# Patient Record
Sex: Male | Born: 1945 | Race: White | Hispanic: No | Marital: Married | Smoking: Never smoker
Health system: Southern US, Community
[De-identification: ages and names within clinical notes are randomized; demographics above are authoritative.]

## PROBLEM LIST (undated history)

## (undated) DIAGNOSIS — I4891 Unspecified atrial fibrillation: Secondary | ICD-10-CM

## (undated) DIAGNOSIS — I509 Heart failure, unspecified: Secondary | ICD-10-CM

## (undated) DIAGNOSIS — E119 Type 2 diabetes mellitus without complications: Secondary | ICD-10-CM

---

## 1998-08-18 ENCOUNTER — Inpatient Hospital Stay (HOSPITAL_COMMUNITY): Admission: EM | Admit: 1998-08-18 | Discharge: 1998-08-21 | Payer: Self-pay | Admitting: Emergency Medicine

## 1998-08-23 ENCOUNTER — Encounter: Admission: RE | Admit: 1998-08-23 | Discharge: 1998-08-23 | Payer: Self-pay | Admitting: Family Medicine

## 2007-03-31 ENCOUNTER — Telehealth: Payer: Self-pay | Admitting: Family Medicine

## 2007-04-01 DIAGNOSIS — G5600 Carpal tunnel syndrome, unspecified upper limb: Secondary | ICD-10-CM | POA: Insufficient documentation

## 2019-09-10 ENCOUNTER — Emergency Department (HOSPITAL_COMMUNITY): Payer: Medicare Other

## 2019-09-10 ENCOUNTER — Encounter (HOSPITAL_COMMUNITY): Payer: Self-pay | Admitting: Emergency Medicine

## 2019-09-10 ENCOUNTER — Inpatient Hospital Stay (HOSPITAL_COMMUNITY)
Admission: EM | Admit: 2019-09-10 | Discharge: 2019-10-10 | DRG: 177 | Disposition: E | Payer: Medicare Other | Attending: Internal Medicine | Admitting: Internal Medicine

## 2019-09-10 ENCOUNTER — Other Ambulatory Visit: Payer: Self-pay

## 2019-09-10 DIAGNOSIS — M7989 Other specified soft tissue disorders: Secondary | ICD-10-CM | POA: Diagnosis not present

## 2019-09-10 DIAGNOSIS — I5033 Acute on chronic diastolic (congestive) heart failure: Secondary | ICD-10-CM | POA: Diagnosis present

## 2019-09-10 DIAGNOSIS — Z66 Do not resuscitate: Secondary | ICD-10-CM | POA: Diagnosis not present

## 2019-09-10 DIAGNOSIS — E669 Obesity, unspecified: Secondary | ICD-10-CM | POA: Diagnosis present

## 2019-09-10 DIAGNOSIS — Z794 Long term (current) use of insulin: Secondary | ICD-10-CM | POA: Diagnosis not present

## 2019-09-10 DIAGNOSIS — J80 Acute respiratory distress syndrome: Secondary | ICD-10-CM

## 2019-09-10 DIAGNOSIS — Z781 Physical restraint status: Secondary | ICD-10-CM | POA: Diagnosis not present

## 2019-09-10 DIAGNOSIS — E1165 Type 2 diabetes mellitus with hyperglycemia: Secondary | ICD-10-CM | POA: Diagnosis not present

## 2019-09-10 DIAGNOSIS — I480 Paroxysmal atrial fibrillation: Secondary | ICD-10-CM | POA: Diagnosis present

## 2019-09-10 DIAGNOSIS — Z91120 Patient's intentional underdosing of medication regimen due to financial hardship: Secondary | ICD-10-CM

## 2019-09-10 DIAGNOSIS — U071 COVID-19: Secondary | ICD-10-CM | POA: Diagnosis present

## 2019-09-10 DIAGNOSIS — R339 Retention of urine, unspecified: Secondary | ICD-10-CM | POA: Diagnosis not present

## 2019-09-10 DIAGNOSIS — Z515 Encounter for palliative care: Secondary | ICD-10-CM

## 2019-09-10 DIAGNOSIS — I10 Essential (primary) hypertension: Secondary | ICD-10-CM | POA: Diagnosis present

## 2019-09-10 DIAGNOSIS — Z7901 Long term (current) use of anticoagulants: Secondary | ICD-10-CM | POA: Diagnosis not present

## 2019-09-10 DIAGNOSIS — R0602 Shortness of breath: Secondary | ICD-10-CM

## 2019-09-10 DIAGNOSIS — J189 Pneumonia, unspecified organism: Secondary | ICD-10-CM

## 2019-09-10 DIAGNOSIS — G9349 Other encephalopathy: Secondary | ICD-10-CM | POA: Diagnosis present

## 2019-09-10 DIAGNOSIS — I110 Hypertensive heart disease with heart failure: Secondary | ICD-10-CM | POA: Diagnosis present

## 2019-09-10 DIAGNOSIS — G9341 Metabolic encephalopathy: Secondary | ICD-10-CM | POA: Diagnosis present

## 2019-09-10 DIAGNOSIS — R451 Restlessness and agitation: Secondary | ICD-10-CM | POA: Diagnosis not present

## 2019-09-10 DIAGNOSIS — T45516A Underdosing of anticoagulants, initial encounter: Secondary | ICD-10-CM | POA: Diagnosis present

## 2019-09-10 DIAGNOSIS — J9601 Acute respiratory failure with hypoxia: Secondary | ICD-10-CM | POA: Diagnosis present

## 2019-09-10 DIAGNOSIS — I4891 Unspecified atrial fibrillation: Secondary | ICD-10-CM | POA: Diagnosis present

## 2019-09-10 DIAGNOSIS — I509 Heart failure, unspecified: Secondary | ICD-10-CM | POA: Insufficient documentation

## 2019-09-10 DIAGNOSIS — J1282 Pneumonia due to coronavirus disease 2019: Secondary | ICD-10-CM | POA: Diagnosis present

## 2019-09-10 DIAGNOSIS — Z6837 Body mass index (BMI) 37.0-37.9, adult: Secondary | ICD-10-CM | POA: Diagnosis not present

## 2019-09-10 DIAGNOSIS — E119 Type 2 diabetes mellitus without complications: Secondary | ICD-10-CM

## 2019-09-10 HISTORY — DX: Heart failure, unspecified: I50.9

## 2019-09-10 HISTORY — DX: Type 2 diabetes mellitus without complications: E11.9

## 2019-09-10 HISTORY — DX: Unspecified atrial fibrillation: I48.91

## 2019-09-10 LAB — D-DIMER, QUANTITATIVE: D-Dimer, Quant: 15.37 ug/mL-FEU — ABNORMAL HIGH (ref 0.00–0.50)

## 2019-09-10 LAB — GLUCOSE, CAPILLARY
Glucose-Capillary: 302 mg/dL — ABNORMAL HIGH (ref 70–99)
Glucose-Capillary: 321 mg/dL — ABNORMAL HIGH (ref 70–99)

## 2019-09-10 LAB — CBC
HCT: 47.1 % (ref 39.0–52.0)
Hemoglobin: 15.4 g/dL (ref 13.0–17.0)
MCH: 29.1 pg (ref 26.0–34.0)
MCHC: 32.7 g/dL (ref 30.0–36.0)
MCV: 88.9 fL (ref 80.0–100.0)
Platelets: 157 10*3/uL (ref 150–400)
RBC: 5.3 MIL/uL (ref 4.22–5.81)
RDW: 13.5 % (ref 11.5–15.5)
WBC: 13.8 10*3/uL — ABNORMAL HIGH (ref 4.0–10.5)
nRBC: 0 % (ref 0.0–0.2)

## 2019-09-10 LAB — CBG MONITORING, ED: Glucose-Capillary: 234 mg/dL — ABNORMAL HIGH (ref 70–99)

## 2019-09-10 LAB — COMPREHENSIVE METABOLIC PANEL
ALT: 15 U/L (ref 0–44)
AST: 27 U/L (ref 15–41)
Albumin: 2.6 g/dL — ABNORMAL LOW (ref 3.5–5.0)
Alkaline Phosphatase: 103 U/L (ref 38–126)
Anion gap: 18 — ABNORMAL HIGH (ref 5–15)
BUN: 14 mg/dL (ref 8–23)
CO2: 20 mmol/L — ABNORMAL LOW (ref 22–32)
Calcium: 8.4 mg/dL — ABNORMAL LOW (ref 8.9–10.3)
Chloride: 100 mmol/L (ref 98–111)
Creatinine, Ser: 0.87 mg/dL (ref 0.61–1.24)
GFR calc Af Amer: >60 mL/min (ref >60)
GFR calc non Af Amer: >60 mL/min (ref >60)
Glucose, Bld: 284 mg/dL — ABNORMAL HIGH (ref 70–99)
Potassium: 3.7 mmol/L (ref 3.5–5.1)
Sodium: 138 mmol/L (ref 135–145)
Total Bilirubin: 2 mg/dL — ABNORMAL HIGH (ref 0.3–1.2)
Total Protein: 6.8 g/dL (ref 6.5–8.1)

## 2019-09-10 LAB — TROPONIN I (HIGH SENSITIVITY)
Troponin I (High Sensitivity): 64 ng/L — ABNORMAL HIGH (ref <18)
Troponin I (High Sensitivity): 99 ng/L — ABNORMAL HIGH (ref <18)

## 2019-09-10 LAB — HEMOGLOBIN A1C
Hgb A1c MFr Bld: 8.5 % — ABNORMAL HIGH (ref 4.8–5.6)
Mean Plasma Glucose: 197.25 mg/dL

## 2019-09-10 LAB — PROCALCITONIN: Procalcitonin: 0.24 ng/mL

## 2019-09-10 LAB — LACTIC ACID, PLASMA
Lactic Acid, Venous: 2.4 mmol/L (ref 0.5–1.9)
Lactic Acid, Venous: 2.6 mmol/L (ref 0.5–1.9)
Lactic Acid, Venous: 3.1 mmol/L (ref 0.5–1.9)

## 2019-09-10 LAB — C-REACTIVE PROTEIN: CRP: 23.9 mg/dL — ABNORMAL HIGH (ref <1.0)

## 2019-09-10 LAB — LACTATE DEHYDROGENASE: LDH: 509 U/L — ABNORMAL HIGH (ref 98–192)

## 2019-09-10 LAB — TRIGLYCERIDES: Triglycerides: 84 mg/dL (ref <150)

## 2019-09-10 LAB — FIBRINOGEN: Fibrinogen: 631 mg/dL — ABNORMAL HIGH (ref 210–475)

## 2019-09-10 LAB — POC SARS CORONAVIRUS 2 AG -  ED: SARS Coronavirus 2 Ag: POSITIVE — AB

## 2019-09-10 LAB — FERRITIN: Ferritin: 540 ng/mL — ABNORMAL HIGH (ref 24–336)

## 2019-09-10 LAB — BRAIN NATRIURETIC PEPTIDE: B Natriuretic Peptide: 148.5 pg/mL — ABNORMAL HIGH (ref 0.0–100.0)

## 2019-09-10 LAB — ABO/RH: ABO/RH(D): O POS

## 2019-09-10 MED ORDER — IPRATROPIUM-ALBUTEROL 0.5-2.5 (3) MG/3ML IN SOLN: 3.0000 mL | RESPIRATORY_TRACT | Status: DC | PRN

## 2019-09-10 MED ORDER — SODIUM CHLORIDE 0.9 % IV SOLN: 200.0000 mg | Freq: Once | INTRAVENOUS | Status: DC

## 2019-09-10 MED ORDER — ACETAMINOPHEN 325 MG PO TABS
650.0000 mg | ORAL_TABLET | Freq: Four times a day (QID) | ORAL | Status: DC | PRN
  Administered 2019-09-12 – 2019-09-13 (×2): 650 mg via ORAL
  Filled 2019-09-10 (×2): qty 2

## 2019-09-10 MED ORDER — SODIUM CHLORIDE 0.9 % IV SOLN: 100.0000 mg | Freq: Every day | INTRAVENOUS | Status: DC

## 2019-09-10 MED ORDER — GUAIFENESIN-DM 100-10 MG/5ML PO SYRP
10.0000 mL | ORAL_SOLUTION | ORAL | Status: DC | PRN
  Administered 2019-09-12: 10 mL via ORAL
  Filled 2019-09-10: qty 10

## 2019-09-10 MED ORDER — ENOXAPARIN SODIUM 150 MG/ML ~~LOC~~ SOLN
1.0000 mg/kg | Freq: Two times a day (BID) | SUBCUTANEOUS | Status: DC
  Administered 2019-09-10 – 2019-09-11 (×2): 125 mg via SUBCUTANEOUS
  Filled 2019-09-10 (×4): qty 0.83

## 2019-09-10 MED ORDER — DILTIAZEM HCL 25 MG/5ML IV SOLN
10.0000 mg | Freq: Once | INTRAVENOUS | Status: AC
  Administered 2019-09-10: 10 mg via INTRAVENOUS
  Filled 2019-09-10: qty 5

## 2019-09-10 MED ORDER — FUROSEMIDE 10 MG/ML IJ SOLN
80.0000 mg | Freq: Once | INTRAMUSCULAR | Status: AC
  Administered 2019-09-10: 80 mg via INTRAVENOUS
  Filled 2019-09-10 (×2): qty 8

## 2019-09-10 MED ORDER — DEXAMETHASONE SODIUM PHOSPHATE 10 MG/ML IJ SOLN: 6.0000 mg | INTRAMUSCULAR | Status: DC

## 2019-09-10 MED ORDER — SODIUM CHLORIDE 0.9 % IV SOLN
200.0000 mg | Freq: Once | INTRAVENOUS | Status: DC
  Filled 2019-09-10: qty 40

## 2019-09-10 MED ORDER — INSULIN ASPART 100 UNIT/ML ~~LOC~~ SOLN
0.0000 [IU] | Freq: Every day | SUBCUTANEOUS | Status: DC
  Administered 2019-09-10 – 2019-09-13 (×4): 4 [IU] via SUBCUTANEOUS
  Administered 2019-09-14: 2 [IU] via SUBCUTANEOUS
  Administered 2019-09-15: 5 [IU] via SUBCUTANEOUS
  Administered 2019-09-16: 3 [IU] via SUBCUTANEOUS

## 2019-09-10 MED ORDER — HYDROCOD POLST-CPM POLST ER 10-8 MG/5ML PO SUER
5.0000 mL | Freq: Two times a day (BID) | ORAL | Status: DC | PRN
  Administered 2019-09-13 – 2019-09-15 (×3): 5 mL via ORAL
  Filled 2019-09-10 (×3): qty 5

## 2019-09-10 MED ORDER — APIXABAN 5 MG PO TABS
5.0000 mg | ORAL_TABLET | Freq: Two times a day (BID) | ORAL | Status: DC
  Filled 2019-09-10 (×2): qty 1

## 2019-09-10 MED ORDER — HYDRALAZINE HCL 20 MG/ML IJ SOLN: 10.0000 mg | Freq: Four times a day (QID) | INTRAMUSCULAR | Status: DC | PRN

## 2019-09-10 MED ORDER — ASCORBIC ACID 500 MG PO TABS
500.0000 mg | ORAL_TABLET | Freq: Every day | ORAL | Status: DC
  Administered 2019-09-10 – 2019-09-17 (×8): 500 mg via ORAL
  Filled 2019-09-10 (×9): qty 1

## 2019-09-10 MED ORDER — ZINC SULFATE 220 (50 ZN) MG PO CAPS
220.0000 mg | ORAL_CAPSULE | Freq: Every day | ORAL | Status: DC
  Administered 2019-09-10 – 2019-09-17 (×8): 220 mg via ORAL
  Filled 2019-09-10 (×9): qty 1

## 2019-09-10 MED ORDER — INSULIN ASPART 100 UNIT/ML ~~LOC~~ SOLN
0.0000 [IU] | Freq: Three times a day (TID) | SUBCUTANEOUS | Status: DC
  Administered 2019-09-10 – 2019-09-11 (×2): 11 [IU] via SUBCUTANEOUS
  Administered 2019-09-11: 15 [IU] via SUBCUTANEOUS
  Administered 2019-09-11: 4 [IU] via SUBCUTANEOUS
  Administered 2019-09-12 – 2019-09-13 (×4): 11 [IU] via SUBCUTANEOUS
  Administered 2019-09-13: 8 [IU] via SUBCUTANEOUS
  Administered 2019-09-13: 11 [IU] via SUBCUTANEOUS
  Administered 2019-09-14: 15 [IU] via SUBCUTANEOUS
  Administered 2019-09-14: 8 [IU] via SUBCUTANEOUS
  Administered 2019-09-15: 3 [IU] via SUBCUTANEOUS
  Administered 2019-09-15: 8 [IU] via SUBCUTANEOUS
  Administered 2019-09-16: 3 [IU] via SUBCUTANEOUS
  Administered 2019-09-16: 5 [IU] via SUBCUTANEOUS
  Administered 2019-09-16: 3 [IU] via SUBCUTANEOUS
  Administered 2019-09-17: 5 [IU] via SUBCUTANEOUS

## 2019-09-10 MED ORDER — SODIUM CHLORIDE 0.9 % IV SOLN
100.0000 mg | Freq: Two times a day (BID) | INTRAVENOUS | Status: DC
  Administered 2019-09-10 – 2019-09-16 (×12): 100 mg via INTRAVENOUS
  Filled 2019-09-10 (×16): qty 100

## 2019-09-10 MED ORDER — DEXAMETHASONE SODIUM PHOSPHATE 10 MG/ML IJ SOLN
6.0000 mg | Freq: Once | INTRAMUSCULAR | Status: AC
  Administered 2019-09-10: 6 mg via INTRAVENOUS
  Filled 2019-09-10: qty 1

## 2019-09-10 MED ORDER — IPRATROPIUM-ALBUTEROL 20-100 MCG/ACT IN AERS
1.0000 | INHALATION_SPRAY | RESPIRATORY_TRACT | Status: DC | PRN
  Administered 2019-09-12 – 2019-09-14 (×4): 1 via RESPIRATORY_TRACT
  Filled 2019-09-10: qty 4

## 2019-09-10 MED ORDER — ONDANSETRON HCL 4 MG/2ML IJ SOLN
4.0000 mg | Freq: Four times a day (QID) | INTRAMUSCULAR | Status: DC | PRN
  Filled 2019-09-10: qty 2

## 2019-09-10 MED ORDER — DEXAMETHASONE SODIUM PHOSPHATE 10 MG/ML IJ SOLN
6.0000 mg | INTRAMUSCULAR | Status: DC
  Administered 2019-09-10: 6 mg via INTRAVENOUS
  Filled 2019-09-10: qty 1

## 2019-09-10 MED ORDER — ONDANSETRON HCL 4 MG PO TABS: 4.0000 mg | ORAL_TABLET | Freq: Four times a day (QID) | ORAL | Status: DC | PRN

## 2019-09-10 NOTE — ED Provider Notes (Signed)
[facility]-EMERGENCY DEPT [facility] Emergency Department Provider Note MRN:  [id number]  Arrival date & time: 10/02/2019     Chief Complaint   Shortness of Breath   History of Present Illness   Curtis Hardy is a 74 y.o. year-old male with a history of diabetes, CHF, A. fib presenting to the ED with chief complaint of shortness of breath.  Recent admission for CHF, tested positive for COVID-19 at that time.  Was also found to have new onset A. fib.  Here with worsening shortness of breath today.  Denies chest pain, no fever, no cough, no other complaints.  Reportedly in the 50s on room air with EMS.  Review of Systems  A complete 10 system review of systems was obtained and all systems are negative except as noted in the HPI and PMH.   Patient's Health History    Past Medical History:  Diagnosis Date  . A-fib (HCC)   . CHF (congestive heart failure) (HCC)   . Diabetes mellitus without complication (HCC)     History reviewed. No pertinent surgical history.  No family history on file.  Social History   Socioeconomic History  . Marital status: Married    Spouse name: Not on file  . Number of children: Not on file  . Years of education: Not on file  . Highest education level: Not on file  Occupational History  . Not on file  Tobacco Use  . Smoking status: Never Smoker  . Smokeless tobacco: Never Used  Substance and Sexual Activity  . Alcohol use: Not Currently  . Drug use: Never  . Sexual activity: Not on file  Other Topics Concern  . Not on file  Social History Narrative  . Not on file   Social Determinants of Health   Financial Resource Strain:   . Difficulty of Paying Living Expenses:   Food Insecurity:   . Worried About Programme researcher, broadcasting/film/video in the Last Year:   . Barista in the Last Year:   Transportation Needs:   . Freight forwarder (Medical):   Marland Kitchen Lack of Transportation (Non-Medical):   Physical Activity:   . Days of Exercise per Week:   .  Minutes of Exercise per Session:   Stress:   . Feeling of Stress :   Social Connections:   . Frequency of Communication with Friends and Family:   . Frequency of Social Gatherings with Friends and Family:   . Attends Religious Services:   . Active Member of Clubs or Organizations:   . Attends Banker Meetings:   Marland Kitchen Marital Status:   Intimate Partner Violence:   . Fear of Current or Ex-Partner:   . Emotionally Abused:   Marland Kitchen Physically Abused:   . Sexually Abused:      Physical Exam   Vitals:   09/16/2019 1400 09/27/2019 1445  BP: (!) 151/99 (!) 153/84  Pulse: (!) 28 (!) 101  Resp: (!) 21 (!) 24  Temp:    SpO2: (!) 89% 90%    CONSTITUTIONAL: Chronically ill-appearing, NAD NEURO:  Alert and oriented x 3, no focal deficits EYES:  eyes equal and reactive ENT/NECK:  no LAD, no JVD CARDIO: Tachycardic rate, well-perfused, normal S1 and S2 PULM: Tachypneic, increased respiratory effort GI/GU:  normal bowel sounds, non-distended, non-tender MSK/SPINE:  No gross deformities, no edema SKIN:  no rash, atraumatic PSYCH:  Appropriate speech and behavior  *Additional and/or pertinent findings included in MDM below  Diagnostic and Interventional  Summary    EKG Interpretation  Date/Time:  Thursday September 10 2019 11:38:30 EDT Ventricular Rate:  125 PR Interval:    QRS Duration: 101 QT Interval:  342 QTC Calculation: 494 R Axis:   -20 Text Interpretation: Atrial fibrillation Borderline left axis deviation Repol abnrm suggests ischemia, lateral leads No previous ECGs available Confirmed by Kennis Carina [phone]) on 10/03/2019 11:47:40 AM      Labs Reviewed  CBC - Abnormal; Notable for the following components:      Result Value   WBC 13.8 (*)    All other components within normal limits  COMPREHENSIVE METABOLIC PANEL - Abnormal; Notable for the following components:   CO2 20 (*)    Glucose, Bld 284 (*)    Calcium 8.4 (*)    Albumin 2.6 (*)    Total Bilirubin 2.0 (*)     Anion gap 18 (*)    All other components within normal limits  BRAIN NATRIURETIC PEPTIDE - Abnormal; Notable for the following components:   B Natriuretic Peptide 148.5 (*)    All other components within normal limits  LACTIC ACID, PLASMA - Abnormal; Notable for the following components:   Lactic Acid, Venous 2.4 (*)    All other components within normal limits  D-DIMER, QUANTITATIVE (NOT AT [facility]) - Abnormal; Notable for the following components:   D-Dimer, Quant 15.37 (*)    All other components within normal limits  FERRITIN - Abnormal; Notable for the following components:   Ferritin 540 (*)    All other components within normal limits  FIBRINOGEN - Abnormal; Notable for the following components:   Fibrinogen 631 (*)    All other components within normal limits  C-REACTIVE PROTEIN - Abnormal; Notable for the following components:   CRP 23.9 (*)    All other components within normal limits  LACTATE DEHYDROGENASE - Abnormal; Notable for the following components:   LDH 509 (*)    All other components within normal limits  CBG MONITORING, ED - Abnormal; Notable for the following components:   Glucose-Capillary 234 (*)    All other components within normal limits  POC SARS CORONAVIRUS 2 AG -  ED - Abnormal; Notable for the following components:   SARS Coronavirus 2 Ag POSITIVE (*)    All other components within normal limits  TROPONIN I (HIGH SENSITIVITY) - Abnormal; Notable for the following components:   Troponin I (High Sensitivity) 64 (*)    All other components within normal limits  TROPONIN I (HIGH SENSITIVITY) - Abnormal; Notable for the following components:   Troponin I (High Sensitivity) 99 (*)    All other components within normal limits  PROCALCITONIN  TRIGLYCERIDES  LACTIC ACID, PLASMA  HEPATITIS B SURFACE ANTIGEN  HEMOGLOBIN A1C  ABO/RH    DG Chest Port 1 View  Final Result    CT ANGIO CHEST PE W OR WO CONTRAST    (Results Pending)  VAS [place] LOWER EXTREMITY  VENOUS (DVT)    (Results Pending)    Medications  guaiFENesin-dextromethorphan (ROBITUSSIN DM) 100-10 MG/5ML syrup 10 mL (has no administration in time range)  chlorpheniramine-HYDROcodone (TUSSIONEX) 10-8 MG/5ML suspension 5 mL (has no administration in time range)  ascorbic acid (VITAMIN C) tablet 500 mg (has no administration in time range)  zinc sulfate capsule 220 mg (has no administration in time range)  acetaminophen (TYLENOL) tablet 650 mg (has no administration in time range)  ondansetron (ZOFRAN) tablet 4 mg (has no administration in time range)    Or  ondansetron (ZOFRAN) injection 4 mg (has no administration in time range)  insulin aspart (novoLOG) injection 0-15 Units (has no administration in time range)  insulin aspart (novoLOG) injection 0-5 Units (has no administration in time range)  furosemide (LASIX) injection 80 mg (has no administration in time range)  doxycycline (VIBRAMYCIN) 100 mg in sodium chloride 0.9 % 250 mL IVPB (has no administration in time range)  enoxaparin (LOVENOX) injection 125 mg (has no administration in time range)  dexamethasone (DECADRON) injection 6 mg (has no administration in time range)  dexamethasone (DECADRON) injection 6 mg (6 mg Intravenous Given 10/04/2019 1326)     Procedures  /  Critical Care .Critical Care Performed by: Maudie Flakes, MD Authorized by: Maudie Flakes, MD   Critical care provider statement:    Critical care time (minutes):  35   Critical care was necessary to treat or prevent imminent or life-threatening deterioration of the following conditions:  Respiratory failure   Critical care was time spent personally by me on the following activities:  Discussions with consultants, evaluation of patient's response to treatment, examination of patient, ordering and performing treatments and interventions, ordering and review of laboratory studies, ordering and review of radiographic studies, pulse oximetry, re-evaluation of  patient's condition, obtaining history from patient or surrogate and review of old charts    ED Course and Medical Decision Making  I have reviewed the triage vital signs, the nursing notes, and pertinent available records from the EMR.  Pertinent labs & imaging results that were available during my care of the patient were reviewed by me and considered in my medical decision making (see below for details).     Question of CHF exacerbation versus worsening COVID-19, profoundly hypoxic with EMS, saturations in the low 90s on nonrebreather, still tachypneic, respiratory therapy consulted for high flow nasal cannula, work-up is pending.  Admitted to stepdown unit for further care.  Barth Kirks. Sedonia Small, MD [place] [email or web address]  Final Clinical Impressions(s) / ED Diagnoses     ICD-10-CM   1. Acute respiratory failure with hypoxia (HCC)  J96.01   2. COVID-19  U07.1     ED Discharge Orders    None       Discharge Instructions Discussed with and Provided to Patient:   Discharge Instructions   None       Maudie Flakes, MD 10/03/2019 1521

## 2019-09-10 NOTE — ED Notes (Signed)
Room air sats 50% - placed on nonrebreather 15L [place] improved to 92

## 2019-09-10 NOTE — Plan of Care (Signed)

## 2019-09-10 NOTE — Progress Notes (Signed)
Updated grandson on pts status. He said he is his primary point of contact regarding Curtis Hardy's condition. Britta Mccreedy (wife) is listed on the summary page, but she has been deceased for 5 years.  [name] [phone]

## 2019-09-10 NOTE — H&P (Addendum)
History and Physical    Curtis Hardy YQM:[id number] DOB: 13-Feb-1946 DOA: 09/26/2019  PCP: Patient, No Pcp Per  Patient coming from: Home  I have personally briefly reviewed patient's old medical records in [place]  Chief Complaint: Worsening shortness of breath  HPI: Curtis Hardy is a 74 y.o. male with medical history significant of new onset A. Fib-on Eliquis, diabetes mellitus presents to emergency department due to worsening of shortness of breath.  Patient recently admitted on 03/19 at [facility] due to COVID-19, new onset A. fib and fluid overload.  He was diuresed and received dexamethasone and remdesivir while he was hospitalized.  His CHA2DS2-VASc score was 3.  Patient discharged home on Eliquis, dexamethasone.  He finished dexamethasone on 3/29.  He tells me that Eliquis is very expensive for him and he takes once a day instead of twice daily.  Reviewed documents from care everywhere: Echo was done which showed ejection fraction of 55 to 60%.  LA normal size, no mitral valve disease.  Today he reports worsening shortness of breath however denies association with fever, chills, cough, congestion, wheezing, headache, blurry vision, leg swelling, orthopnea, PND, nausea, vomiting, diarrhea.  No history of smoking, alcohol, illicit drug use.  ED Course: Upon arrival to ED: Patient tachycardic, tachypneic, hypoxic in 50s.  Placed on nonrebreather mask, he is afebrile with leukocytosis of 13.8, POC COVID-19 positive, chest x-ray shows multifocal pneumonia.  Patient received Decadron 10 mg once in ED.   [facility] hospital[facility] consulted for admission for acute hypoxic respiratory failure due to Covid versus fluid overload. Review of Systems: As per HPI otherwise negative.    Past Medical History:  Diagnosis Date  . A-fib ([place])   . CHF (congestive heart failure) ([place])   . Diabetes mellitus without complication ([place])     History reviewed. No pertinent  surgical history.   reports that he has never smoked. He has never used smokeless tobacco. He reports previous alcohol use. He reports that he does not use drugs.  No Known Allergies  No family history on file.  Prior to Admission medications   Medication Sig Start Date End Date Taking? Authorizing Provider  apixaban (ELIQUIS) 5 MG TABS tablet Take 5 mg by mouth in the morning and at bedtime. 08/30/19  Yes [provider]  glipiZIDE (GLUCOTROL XL) 5 MG 24 hr tablet Take 5 mg by mouth daily. 08/05/18  Yes [provider]  ibuprofen (ADVIL) 200 MG tablet Take 400 mg by mouth every 6 (six) hours as needed for moderate pain.   Yes [provider]  insulin isophane & regular human (HUMULIN 70/30 KWIKPEN) (70-30) 100 UNIT/ML KwikPen Inject 10 Units into the skin daily.   Yes [provider]    Physical Exam: Vitals:   10/06/2019 1139 10/09/2019 1141 09/29/2019 1200 09/19/2019 1230  BP: (!) 161/138  132/76 (!) 139/91  Pulse:   (!) 114 (!) 104  Resp: (!) 26  18 (!) 21  Temp: (!) 97.4 F (36.3 C)     TempSrc: Oral     SpO2: (!) 50% 91% 95% 99%  Height:        Constitutional: In mild respiratory distress, has HFNC, morbidly obese Eyes: PERRL, lids and conjunctivae normal ENMT: Mucous membranes are moist. Posterior pharynx clear of any exudate or lesions.Normal dentition.  Neck: normal, supple, no masses, no thyromegaly Respiratory: clear to auscultation bilaterally, no wheezing, no crackles. Normal respiratory effort. No accessory muscle use.  Cardiovascular:  Regular rate and rhythm, no murmurs / rubs / gallops. No extremity edema. 2+ pedal pulses. No carotid bruits.  Abdomen: no tenderness, no masses palpated. No hepatosplenomegaly. Bowel sounds positive.  Musculoskeletal: no clubbing / cyanosis. No joint deformity upper and lower extremities. Good ROM, no contractures. Normal muscle tone.  Skin: no rashes, lesions, ulcers. No induration Neurologic: CN 2-12  grossly intact. Sensation intact, DTR normal. Strength 5/5 in all 4.  Psychiatric: Normal judgment and insight. Alert and oriented x 3. Normal mood.    Labs on Admission: I have personally reviewed following labs and imaging studies  CBC: Recent Labs  Lab 10/07/2019 1146  WBC 13.8*  HGB 15.4  HCT 47.1  MCV 88.9  PLT 157   Basic Metabolic Panel: Recent Labs  Lab 09/23/2019 1146  NA 138  K 3.7  CL 100  CO2 20*  GLUCOSE 284*  BUN 14  CREATININE 0.87  CALCIUM 8.4*   GFR: CrCl cannot be calculated (Unknown ideal weight.). Liver Function Tests: Recent Labs  Lab 09/21/2019 1146  AST 27  ALT 15  ALKPHOS 103  BILITOT 2.0*  PROT 6.8  ALBUMIN 2.6*   No results for input(s): LIPASE, AMYLASE in the last 168 hours. No results for input(s): AMMONIA in the last 168 hours. Coagulation Profile: No results for input(s): INR, PROTIME in the last 168 hours. Cardiac Enzymes: No results for input(s): CKTOTAL, CKMB, CKMBINDEX, TROPONINI in the last 168 hours. BNP (last 3 results) No results for input(s): PROBNP in the last 8760 hours. HbA1C: No results for input(s): HGBA1C in the last 72 hours. CBG: Recent Labs  Lab 09/25/2019 1138  GLUCAP 234*   Lipid Profile: Recent Labs    09/28/2019 1250  TRIG 84   Thyroid Function Tests: No results for input(s): TSH, T4TOTAL, FREET4, T3FREE, THYROIDAB in the last 72 hours. Anemia Panel: No results for input(s): VITAMINB12, FOLATE, FERRITIN, TIBC, IRON, RETICCTPCT in the last 72 hours. Urine analysis: No results found for: COLORURINE, APPEARANCEUR, LABSPEC, PHURINE, GLUCOSEU, HGBUR, BILIRUBINUR, KETONESUR, PROTEINUR, UROBILINOGEN, NITRITE, LEUKOCYTESUR  Radiological Exams on Admission: DG Chest Port 1 View  Result Date: 09/24/2019 CLINICAL DATA:  Shortness breath with COVID-19 positive status EXAM: PORTABLE CHEST 1 VIEW COMPARISON:  None. FINDINGS: Extensive airspace opacity is noted throughout the right lung as well as in the left lower  lung region. Consolidation is noted in portions of the right lower lobe. Heart size and pulmonary vascularity within normal limits. No adenopathy. There are multiple old healed rib fractures on the left. There is degenerative change in each shoulder. IMPRESSION: Multifocal airspace opacity, somewhat more pronounced on the right than on the left with consolidation in the right base. Appearance consistent with multifocal pneumonia. There may be a degree of underlying parenchymal fibrotic type change. Heart size within normal limits. No adenopathy evident. Multiple old healed rib fractures on the left. Electronically Signed   By: Bretta Bang III M.D.   On: 10/06/2019 13:00    EKG: Independently reviewed.  A. fib, left axis deviation.  Assessment/Plan Principal Problem:   Acute hypoxemic respiratory failure due to COVID-19 [facility]) Active Problems:   Diabetes mellitus without complication (HCC)   CHF (congestive heart failure) (HCC)   A-fib (HCC)   HTN (hypertension)   Acute hypoxemic respiratory failure due to COVID-19 pneumonia versus fluid overload: -Patient hypoxic, tachycardic and tachypneic upon arrival to ED.  He is requiring nonrebreather mask.  He is afebrile with leukocytosis of 13.8, lactic acid is elevated.  COVID-19 positive, chest  x-ray shows multifocal pneumonia. -Admit patient to stepdown unit for close monitoring.  On continuous pulse ox.  Try to wean off of oxygen as tolerated. -Patient received remdesivir recently. -Lasix 80 mg IV was given in the ED.  Continue Decadron -Inflammatory markers elevated -CRP: 23, D-dimer: 15.37, procalcitonin: 0.24 -We will get CT angiogram of chest to rule out PE and Doppler ultrasound of bilateral lower extremity to rule out DVT.  We will start patient on full dose Lovenox for now.  We will start on Doxy.  Blood culture is pending -Patient was told that if COVID-19 pneumonitis gets worse we might potentially use Actemra off label, he denies any  known history of tuberculosis or hepatitis and understands the risk and benefits and wants to proceed with Actemra treatment if required. -DuoNebs as needed.  Start on p.o. vitamins. -Strict INO's and daily weight.  New onset A. Fib: -Patient recently diagnosed with A. fib.  His CHA2DS2-VASc score is 3 -Reviewed documents from care everywhere. -He is noncompliant with Eliquis due to financial issues -He is on telemetry.  Reviewed EKG.  Monitor electrolytes. -Start on full dose Lovenox.  Diabetes mellitus: Check A1c -Hold glipizide & home Humulin 70/30 and start patient on sliding scale insulin and monitor blood sugar closely.  Elevated blood pressure without diagnosis of hypertension: -Hydralazine as needed.  Continue to monitor blood pressure.  DVT prophylaxis: Full dose Lovenox/SCD/TED  code Status: Full code-confirmed with the patient Family Communication: None present at bedside.  Plan of care discussed with patient in length and he verbalized understanding and agreed with it. Disposition Plan: To be determined Consults called: None Admission status: Inpatient stepdown unit   Ollen Bowl MD [facility] Pager [phone]  If 7PM-7AM, please contact night-coverage [email or web address] Password [email or web address]  09-29-2019, 1:56 PM

## 2019-09-10 NOTE — Progress Notes (Signed)
Pt refusing to go down for CT scan. States he doesn't feel like it, and prefers to go in the morning. CT called and informed. Will let on call MD know as well.

## 2019-09-10 NOTE — ED Triage Notes (Signed)
Pt arrives via EMS for SOB. Room air sats 50% when EMS arrived to his home. Pt recently had COVID. Tested negative last week and was released from quarantine. Today, pt felt SOB around 9am. Pt has new diagnosis of CHF. Has hx of Afib. HR 100-118 Afib on the monitor. CBG 260. Pt very tachypnic. EMS placed nonrebreather on pt. 94% on 15L nonrebreather.

## 2019-09-10 NOTE — Progress Notes (Signed)
Rapid and respiratory notified of pts O2 sat fluctuating from 86%-90% on 15L HFNC and NRB. [place] also made aware. Will continue to monitor.

## 2019-09-10 NOTE — Progress Notes (Signed)
ANTICOAGULATION CONSULT NOTE - Initial Consult  Pharmacy Consult for lovenox Indication: Afib, elevated D-dimer in setting of COVID infection, PE r/o  No Known Allergies  Patient Measurements: Height: 6' (182.9 cm) IBW/kg (Calculated) : 77.6  Vital Signs: Temp: 97.4 F (36.3 C) (04/01 1139) Temp Source: Oral (04/01 1139) BP: 153/84 (04/01 1445) Pulse Rate: 101 (04/01 1445)  Labs: Recent Labs    2019-09-27 1146  HGB 15.4  HCT 47.1  PLT 157  CREATININE 0.87  TROPONINIHS 64*    CrCl cannot be calculated (Unknown ideal weight.).   Medical History: Past Medical History:  Diagnosis Date  . A-fib (HCC)   . CHF (congestive heart failure) (HCC)   . Diabetes mellitus without complication (HCC)     Assessment: 70 YOM presenting with SOB, on Eliquis PTA for Afib (has been taking only once daily d/t cost), last dose 3/31 @2200 .  D-dimer 15.37  Goal of Therapy:  Anti-Xa level 0.6-1 units/ml 4hrs after LMWH dose given Monitor platelets by anticoagulation protocol: Yes   Plan:  Lovenox 1mg /kg (125mg  ) every 12 hours Monitor renal function, s/s bleeding  , PharmD Clinical Pharmacist ED Pharmacist Phone # [phone] 27-Sep-2019 3:05 PM

## 2019-09-11 ENCOUNTER — Inpatient Hospital Stay (HOSPITAL_COMMUNITY): Payer: Medicare Other

## 2019-09-11 DIAGNOSIS — M7989 Other specified soft tissue disorders: Secondary | ICD-10-CM

## 2019-09-11 LAB — CBC WITH DIFFERENTIAL/PLATELET
Abs Immature Granulocytes: 0.09 10*3/uL — ABNORMAL HIGH (ref 0.00–0.07)
Basophils Absolute: 0 10*3/uL (ref 0.0–0.1)
Basophils Relative: 0 %
Eosinophils Absolute: 0 10*3/uL (ref 0.0–0.5)
Eosinophils Relative: 0 %
HCT: 45.1 % (ref 39.0–52.0)
Hemoglobin: 15.3 g/dL (ref 13.0–17.0)
Immature Granulocytes: 1 %
Lymphocytes Relative: 4 %
Lymphs Abs: 0.4 10*3/uL — ABNORMAL LOW (ref 0.7–4.0)
MCH: 29.3 pg (ref 26.0–34.0)
MCHC: 33.9 g/dL (ref 30.0–36.0)
MCV: 86.4 fL (ref 80.0–100.0)
Monocytes Absolute: 0.3 10*3/uL (ref 0.1–1.0)
Monocytes Relative: 2 %
Neutro Abs: 10.6 10*3/uL — ABNORMAL HIGH (ref 1.7–7.7)
Neutrophils Relative %: 93 %
Platelets: 157 10*3/uL (ref 150–400)
RBC: 5.22 MIL/uL (ref 4.22–5.81)
RDW: 13.5 % (ref 11.5–15.5)
WBC: 11.5 10*3/uL — ABNORMAL HIGH (ref 4.0–10.5)
nRBC: 0 % (ref 0.0–0.2)

## 2019-09-11 LAB — COMPREHENSIVE METABOLIC PANEL
ALT: 16 U/L (ref 0–44)
AST: 28 U/L (ref 15–41)
Albumin: 2.3 g/dL — ABNORMAL LOW (ref 3.5–5.0)
Alkaline Phosphatase: 103 U/L (ref 38–126)
Anion gap: 16 — ABNORMAL HIGH (ref 5–15)
BUN: 17 mg/dL (ref 8–23)
CO2: 22 mmol/L (ref 22–32)
Calcium: 8.3 mg/dL — ABNORMAL LOW (ref 8.9–10.3)
Chloride: 97 mmol/L — ABNORMAL LOW (ref 98–111)
Creatinine, Ser: 0.84 mg/dL (ref 0.61–1.24)
GFR calc Af Amer: >60 mL/min (ref >60)
GFR calc non Af Amer: >60 mL/min (ref >60)
Glucose, Bld: 302 mg/dL — ABNORMAL HIGH (ref 70–99)
Potassium: 3.5 mmol/L (ref 3.5–5.1)
Sodium: 135 mmol/L (ref 135–145)
Total Bilirubin: 1.3 mg/dL — ABNORMAL HIGH (ref 0.3–1.2)
Total Protein: 6.8 g/dL (ref 6.5–8.1)

## 2019-09-11 LAB — PROCALCITONIN: Procalcitonin: 0.71 ng/mL

## 2019-09-11 LAB — GLUCOSE, CAPILLARY
Glucose-Capillary: 290 mg/dL — ABNORMAL HIGH (ref 70–99)
Glucose-Capillary: 326 mg/dL — ABNORMAL HIGH (ref 70–99)
Glucose-Capillary: 303 mg/dL — ABNORMAL HIGH (ref 70–99)
Glucose-Capillary: 356 mg/dL — ABNORMAL HIGH (ref 70–99)
Glucose-Capillary: 321 mg/dL — ABNORMAL HIGH (ref 70–99)

## 2019-09-11 LAB — BRAIN NATRIURETIC PEPTIDE: B Natriuretic Peptide: 181.2 pg/mL — ABNORMAL HIGH (ref 0.0–100.0)

## 2019-09-11 LAB — HEPATITIS B SURFACE ANTIGEN: Hepatitis B Surface Ag: NONREACTIVE

## 2019-09-11 LAB — MRSA PCR SCREENING: MRSA by PCR: NEGATIVE

## 2019-09-11 LAB — MAGNESIUM: Magnesium: 1.7 mg/dL (ref 1.7–2.4)

## 2019-09-11 LAB — D-DIMER, QUANTITATIVE: D-Dimer, Quant: >20 ug/mL-FEU — ABNORMAL HIGH (ref 0.00–0.50)

## 2019-09-11 LAB — C-REACTIVE PROTEIN: CRP: 27 mg/dL — ABNORMAL HIGH (ref <1.0)

## 2019-09-11 LAB — FERRITIN: Ferritin: 694 ng/mL — ABNORMAL HIGH (ref 24–336)

## 2019-09-11 LAB — PHOSPHORUS: Phosphorus: 3.4 mg/dL (ref 2.5–4.6)

## 2019-09-11 MED ORDER — MAGNESIUM SULFATE IN D5W 1-5 GM/100ML-% IV SOLN
1.0000 g | Freq: Once | INTRAVENOUS | Status: AC
  Administered 2019-09-11: 1 g via INTRAVENOUS
  Filled 2019-09-11: qty 100

## 2019-09-11 MED ORDER — FUROSEMIDE 10 MG/ML IJ SOLN
40.0000 mg | Freq: Once | INTRAMUSCULAR | Status: AC
  Administered 2019-09-11: 40 mg via INTRAVENOUS
  Filled 2019-09-11: qty 4

## 2019-09-11 MED ORDER — SALINE SPRAY 0.65 % NA SOLN
1.0000 | NASAL | Status: DC | PRN
  Filled 2019-09-11: qty 44

## 2019-09-11 MED ORDER — METHYLPREDNISOLONE SODIUM SUCC 125 MG IJ SOLR
60.0000 mg | Freq: Two times a day (BID) | INTRAMUSCULAR | Status: DC
  Administered 2019-09-11 – 2019-09-13 (×6): 60 mg via INTRAVENOUS
  Filled 2019-09-11 (×6): qty 2

## 2019-09-11 MED ORDER — POTASSIUM CHLORIDE CRYS ER 20 MEQ PO TBCR
40.0000 meq | EXTENDED_RELEASE_TABLET | Freq: Once | ORAL | Status: AC
  Administered 2019-09-11: 40 meq via ORAL
  Filled 2019-09-11: qty 2

## 2019-09-11 MED ORDER — IOHEXOL 350 MG/ML SOLN
75.0000 mL | Freq: Once | INTRAVENOUS | Status: AC | PRN
  Administered 2019-09-11: 75 mL via INTRAVENOUS

## 2019-09-11 MED ORDER — DILTIAZEM HCL 60 MG PO TABS
90.0000 mg | ORAL_TABLET | Freq: Three times a day (TID) | ORAL | Status: DC
  Administered 2019-09-11 – 2019-09-13 (×6): 90 mg via ORAL
  Filled 2019-09-11 (×6): qty 2

## 2019-09-11 MED ORDER — ENOXAPARIN SODIUM 120 MG/0.8ML ~~LOC~~ SOLN
115.0000 mg | Freq: Two times a day (BID) | SUBCUTANEOUS | Status: AC
  Administered 2019-09-11 – 2019-09-13 (×5): 115 mg via SUBCUTANEOUS
  Filled 2019-09-11 (×5): qty 0.77

## 2019-09-11 MED ORDER — TOCILIZUMAB 400 MG/20ML IV SOLN
800.0000 mg | Freq: Once | INTRAVENOUS | Status: AC
  Administered 2019-09-11: 800 mg via INTRAVENOUS
  Filled 2019-09-11: qty 40

## 2019-09-11 NOTE — Progress Notes (Signed)
RT and oncoming RN made aware MD stated that if patient desats below 85% or becomes symptomatic, he is to be put on heated high flow oxygen. Patient seems to be a mouth breather and is currently on 10L NRB without HFNC with sats in low 90's and appears to be comfortable. MD said okay to leave on NRB without HFNC. Will continue to monitor.

## 2019-09-11 NOTE — Progress Notes (Addendum)
Spoke with RT about the pt being on 10L NRB, and doing well as of right now. RT stated she would come back and put him on heated high flow if needed if NRB nor HFNC is not enough to maintain 85%. Will continue to monitor.

## 2019-09-11 NOTE — Progress Notes (Addendum)
PROGRESS NOTE                                                                                                                                                                                                             Patient Demographics:    Curtis Hardy, is a 74 y.o. male, DOB - [birth year], HYI:[id number]  Outpatient Primary MD for the patient is Patient, No Pcp Per    LOS - 1  Admit date - 09/19/2019    Chief Complaint  Patient presents with  . Shortness of Breath       Brief Narrative  Curtis Hardy is a 74 y.o. male with medical history significant of new onset A. Fib-on Eliquis, diabetes mellitus presents to emergency department due to worsening of shortness of breath.  Patient recently admitted on 03/19 at [facility] due to COVID-19, new onset A. fib and fluid overload.  He was diuresed and received dexamethasone and remdesivir while he was hospitalized.  His CHA2DS2-VASc score was 3.  Patient discharged home on Eliquis, dexamethasone.  He finished dexamethasone on 09/07/19.  ER at [facility] he was found to have severe hypoxic respiratory failure requiring 15 L nasal cannula oxygen and was admitted to the hospital.   Subjective:    Curtis Hardy today has, No headache, No chest pain, No abdominal pain - No Nausea, No new weakness tingling or numbness,++ Cough - SOB.     Assessment  & Plan :     1. Acute Hypoxic Resp. Failure due to Acute Covid 19 Viral Pneumonitis during the ongoing 2020 Covid 19 Pandemic - he has severe parenchymal injury, his symptoms have been present for at least 7 to 10 days if not more.  He has been started on IV steroids high-dose, he has already finished remdesivir during his previous hospitalization at [facility] recently, has consented for Actemra use and I would use it on 09/11/2019.  He has pretty advanced parenchymal disease and we are several days into  work.   Keep HHFL o2 in the room.   Encouraged the patient to sit up in chair in the daytime use I-S and flutter valve for pulmonary toiletry and then prone in bed when at night.  Will advance activity and titrate down oxygen as possible.  Actemra off label use - patient was  told that if COVID-19 pneumonitis gets worse we might potentially use Actemra off label, patient denies any known history of tuberculosis or hepatitis, understands the risks and benefits and wants to proceed with Actemra treatment if required.   SpO2: 90 % O2 Flow Rate (L/min): 15 L/min  Recent Labs  Lab 09/20/2019 1146 09/22/2019 1250 09/11/19 0554  CRP  --  23.9* 27.0*  DDIMER  --  15.37* >20.00*  BNP 148.5*  --  181.2*  PROCALCITON  --  0.24 0.71    Hepatic Function Latest Ref Rng & Units 09/11/2019 09/14/2019  Total Protein 6.5 - 8.1 g/dL 6.8 6.8  Albumin 3.5 - 5.0 g/dL 2.3(L) 2.6(L)  AST 15 - 41 U/L 28 27  ALT 0 - 44 U/L 16 15  Alk Phosphatase 38 - 126 U/L 103 103  Total Bilirubin 0.3 - 1.2 mg/dL 1.3(H) 2.0(H)      2.  Elevated D-dimer.  CTA negative for PE however he is at very high risk of developing a blood clot due to intense inflammation, continue full dose Lovenox and monitor.  We will also check lower extremity venous duplex.   3.  Recent paroxysmal A. fib.  Placed on low-dose oral Cardizem, [place] vas 2 score is greater than 2 and was started on Eliquis at [facility], for now full dose Lovenox and monitor.  4.  Acute on chronic diastolic CHF.  EF 55% on recent echocardiogram at [facility].  Likely due to RVR.  Gentle Lasix and monitor.    Condition - Extremely Guarded  Family Communication  :  Alain Marion - [phone] -  09/11/19 - cleared explained prognosis is extremely GUARDED.  Code Status :  Full  Diet :   Diet Order            Diet 2 gram sodium Room service appropriate? Yes; Fluid consistency: Thin  Diet effective now               Disposition Plan  :  Stay in the  Hospital for severe Hypoxic Resp failure   Consults  :  None  Procedures  :    CTA -   PUD Prophylaxis :    DVT Prophylaxis  :  Lovenox   Lab Results  Component Value Date   PLT 157 09/11/2019    Inpatient Medications  Scheduled Meds: . vitamin C  500 mg Oral Daily  . enoxaparin (LOVENOX) injection  1 mg/kg Subcutaneous Q12H  . furosemide  40 mg Intravenous Once  . insulin aspart  0-15 Units Subcutaneous TID WC  . insulin aspart  0-5 Units Subcutaneous QHS  . methylPREDNISolone (SOLU-MEDROL) injection  60 mg Intravenous Q12H  . zinc sulfate  220 mg Oral Daily   Continuous Infusions: . doxycycline (VIBRAMYCIN) IV 100 mg (09/11/19 0241)  . tocilizumab (ACTEMRA) - non-COVID treatment     PRN Meds:.acetaminophen, chlorpheniramine-HYDROcodone, guaiFENesin-dextromethorphan, hydrALAZINE, Ipratropium-Albuterol, [DISCONTINUED] ondansetron **OR** ondansetron (ZOFRAN) IV  Antibiotics  :    Anti-infectives (From admission, onward)   Start     Dose/Rate Route Frequency Ordered Stop   09/11/19 1000  remdesivir 100 mg in sodium chloride 0.9 % 100 mL IVPB  Status:  Discontinued     100 mg 200 mL/hr over 30 Minutes Intravenous Daily 09/11/2019 1349 09/28/2019 1359   09/11/19 1000  remdesivir 100 mg in sodium chloride 0.9 % 100 mL IVPB  Status:  Discontinued     100 mg 200 mL/hr over 30 Minutes Intravenous Daily 10/07/2019 1402  10/07/2019 1518   10/01/2019 1530  doxycycline (VIBRAMYCIN) 100 mg in sodium chloride 0.9 % 250 mL IVPB     100 mg 125 mL/hr over 120 Minutes Intravenous Every 12 hours 09/17/2019 1501     10/08/2019 1500  remdesivir 200 mg in sodium chloride 0.9% 250 mL IVPB  Status:  Discontinued     200 mg 580 mL/hr over 30 Minutes Intravenous Once 09/19/2019 1402 09/27/2019 1518   09/12/2019 1400  remdesivir 200 mg in sodium chloride 0.9% 250 mL IVPB  Status:  Discontinued     200 mg 580 mL/hr over 30 Minutes Intravenous Once 09/16/2019 1349 09/29/2019 1359       Time Spent in minutes   30   Lala Lund M.D on 09/11/2019 at 11:34 AM  To page go to [email or web address] - password [facility]  [facility] -  Office  [phone]    See all Orders from today for further details    Objective:   Vitals:   09/11/19 0448 09/11/19 0800 09/11/19 0838 09/11/19 0904  BP:  (!) 151/99 126/77   Pulse:  (!) 111 (!) 109 (!) 102  Resp:  (!) 35 (!) 27 (!) 32  Temp:  98 F (36.7 C) 98.5 F (36.9 C)   TempSrc:  Axillary Oral   SpO2:  (!) 81% (!) 84% 90%  Weight: 114.5 kg     Height:        Wt Readings from Last 3 Encounters:  09/11/19 114.5 kg     Intake/Output Summary (Last 24 hours) at 09/11/2019 1134 Last data filed at 09/11/2019 0831 Gross per 24 hour  Intake 1128 ml  Output 1450 ml  Net -322 ml     Physical Exam  Awake Alert, No new F.N deficits, Normal affect Force.AT,PERRAL Supple Neck,No JVD, No cervical lymphadenopathy appriciated.  Symmetrical Chest wall movement, Good air movement bilaterally, CTAB RRR,No Gallops,Rubs or new Murmurs, No Parasternal Heave +ve B.Sounds, Abd Soft, No tenderness, No organomegaly appriciated, No rebound - guarding or rigidity. No Cyanosis, Clubbing or edema, No new Rash or bruise      Data Review:    CBC Recent Labs  Lab 09/16/2019 1146 09/11/19 0554  WBC 13.8* 11.5*  HGB 15.4 15.3  HCT 47.1 45.1  PLT 157 157  MCV 88.9 86.4  MCH 29.1 29.3  MCHC 32.7 33.9  RDW 13.5 13.5  LYMPHSABS  --  0.4*  MONOABS  --  0.3  EOSABS  --  0.0  BASOSABS  --  0.0    Chemistries  Recent Labs  Lab 10/07/2019 1146 09/27/2019 1632 09/11/19 0554  NA 138  --  135  K 3.7  --  3.5  CL 100  --  97*  CO2 20*  --  22  GLUCOSE 284*  --  302*  BUN 14  --  17  CREATININE 0.87  --  0.84  CALCIUM 8.4*  --  8.3*  AST 27  --  28  ALT 15  --  16  ALKPHOS 103  --  103  BILITOT 2.0*  --  1.3*  MG  --   --  1.7  HGBA1C  --  8.5*  --       ------------------------------------------------------------------------------------------------------------------ Recent Labs    09/26/2019 1250  TRIG 84    Lab Results  Component Value Date   HGBA1C 8.5 (H) 09/12/2019   ------------------------------------------------------------------------------------------------------------------ No results for input(s): TSH, T4TOTAL, T3FREE, THYROIDAB in the last 72 hours.  Invalid input(s): FREET3  Cardiac  Enzymes No results for input(s): CKMB, TROPONINI, MYOGLOBIN in the last 168 hours.  Invalid input(s): CK ------------------------------------------------------------------------------------------------------------------    Component Value Date/Time   BNP 181.2 (H) 09/11/2019 0554    Micro Results Recent Results (from the past 240 hour(s))  MRSA PCR Screening     Status: None   Collection Time: 09/11/19  4:30 AM   Specimen: Nasal Mucosa; Nasopharyngeal  Result Value Ref Range Status   MRSA by PCR NEGATIVE NEGATIVE Final    Comment:        The GeneXpert MRSA Assay (FDA approved for NASAL specimens only), is one component of a comprehensive MRSA colonization surveillance program. It is not intended to diagnose MRSA infection nor to guide or monitor treatment for MRSA infections. Performed at [facility], [place] [address], [place], [place] [address]     Radiology Reports CT ANGIO CHEST PE W OR WO CONTRAST  Result Date: 09/11/2019 CLINICAL DATA:  Shortness of breath.  COVID-19 positive EXAM: CT ANGIOGRAPHY CHEST WITH CONTRAST TECHNIQUE: Multidetector CT imaging of the chest was performed using the standard protocol during bolus administration of intravenous contrast. Multiplanar CT image reconstructions and MIPs were obtained to evaluate the vascular anatomy. CONTRAST:  73m OMNIPAQUE IOHEXOL 350 MG/ML SOLN COMPARISON:  Chest radiograph September 10, 2019 FINDINGS: Cardiovascular: There is no demonstrable pulmonary embolus. There  is no thoracic aortic aneurysm or dissection. There are scattered foci of great vessel calcification. There is aortic atherosclerosis. There are multiple foci of coronary artery calcification. There is no pericardial effusion or pericardial thickening. Mediastinum/Nodes: There is a 6 mm nodular opacity in the left lobe of the thyroid which per consensus guidelines does not warrant additional imaging surveillance. Thyroid otherwise appears unremarkable. There is a 1.4 x 1.1 cm subcarinal lymph node. Elsewhere there are scattered subcentimeter mediastinal lymph nodes which do not meet size criteria for pathologic significance. No esophageal lesions are appreciable. Lungs/Pleura: There is widespread airspace opacity throughout the lungs diffusely with involvement of most lobes in segments. There is mild fibrotic change in the apices. No appreciable pleural effusions. Upper Abdomen: In the visualized upper abdomen, there is aortic atherosclerosis. Visualized upper abdominal structures otherwise appear unremarkable. Musculoskeletal: There is degenerative change in the mid and lower thoracic spine with diffuse idiopathic skeletal hyperostosis. There old healed rib fractures on the left with remodeling. No blastic or lytic bone lesions. No evident chest wall lesions. Review of the MIP images confirms the above findings. IMPRESSION: 1. No demonstrable pulmonary embolus. No thoracic aortic aneurysm or dissection. There is aortic atherosclerosis as well as foci of great vessel and coronary artery calcification. 2. Widespread airspace opacity throughout the lungs diffusely consistent with widespread multifocal pneumonia. Suspect atypical organism pneumonia, particularly given history. 3. Mildly prominent subcarinal lymph node which may well be of reactive etiology given the extensive lung parenchymal abnormality. No other adenopathy by size criteria. 4.  Diffuse idiopathic skeletal hyperostosis. Aortic Atherosclerosis  (ICD10-I70.0). Electronically Signed   By: WLowella GripIII M.D.   On: 09/11/2019 10:00   DG Chest Port 1 View  Result Date: 09/27/2019 CLINICAL DATA:  Shortness breath with COVID-19 positive status EXAM: PORTABLE CHEST 1 VIEW COMPARISON:  None. FINDINGS: Extensive airspace opacity is noted throughout the right lung as well as in the left lower lung region. Consolidation is noted in portions of the right lower lobe. Heart size and pulmonary vascularity within normal limits. No adenopathy. There are multiple old healed rib fractures on the left. There is degenerative change  in each shoulder. IMPRESSION: Multifocal airspace opacity, somewhat more pronounced on the right than on the left with consolidation in the right base. Appearance consistent with multifocal pneumonia. There may be a degree of underlying parenchymal fibrotic type change. Heart size within normal limits. No adenopathy evident. Multiple old healed rib fractures on the left. Electronically Signed   By: Lowella Grip III M.D.   On: 09/28/2019 13:00

## 2019-09-11 NOTE — Progress Notes (Signed)
ANTICOAGULATION CONSULT NOTE - Follow Up Consult  Pharmacy Consult for Lovenox Indication: atrial fibrillation and elevated d-dimer in setting of COVID infection, r/o PE  No Known Allergies  Patient Measurements: Height: 6' (182.9 cm) Weight: 114.5 kg (252 lb 6.8 oz) IBW/kg (Calculated) : 77.6 Lovenox Dosing Weight: 114.5 kg  Vital Signs: Temp: 97.9 F (36.6 C) (04/02 1205) Temp Source: Oral (04/02 1205) BP: 157/88 (04/02 1205) Pulse Rate: 102 (04/02 0904)  Labs: Recent Labs    09/26/2019 1146 09/20/2019 1330 09/11/19 0554  HGB 15.4  --  15.3  HCT 47.1  --  45.1  PLT 157  --  157  CREATININE 0.87  --  0.84  TROPONINIHS 64* 99*  --     Estimated Creatinine Clearance: 103.9 mL/min (by C-G formula based on SCr of 0.84 mg/dL).  Assessment:   74 yr old male presented 09/21/2019 with shortness of breath. Prescribed Eliquis 5 mg BID PTA for atrial fibrillation (but noted only taking once daily due to cost).  Last dose 3/31 at 10pm.  D-dimer 15.37 on admit.  Transitioned to full-dose Lovenox on 4/1.  CTA negative for PE today. D-dimer now >20.     Admit weight listed as 124.7 kg > now updated to 114.5 kg.   Has had Lovenox 125 mg SQ q12h x 2 doses.  Goal of Therapy:  Anti-Xa level 0.6-1 units/ml 4hrs after LMWH dose given Monitor platelets by anticoagulation protocol: Yes   Plan:   Adjust Lovenox to 115 mg SQ q12hrs for updated weight.  Follow up CBC, renal function.   Monitor for s/sx bleeding.  Eliquis on hold. Follow up anticoagulation plans.  Dennie Fetters, [place] Phone: [phone] 09/11/2019,12:21 PM

## 2019-09-11 NOTE — Progress Notes (Signed)
Bilateral lower extremity venous duplex exam completed.  Preliminary results can be found under CV proc under chart review.  09/11/2019 4:23 PM  Leahanna Buser, K., RDMS, RVT

## 2019-09-11 NOTE — Progress Notes (Signed)
Took pt off of 15L NRB and left 15L HFNC on and he desats to high 70s. Took off 15L HFNC and left 15L NRB on and he stays at 95%. Will keep NRB on without HFNC. Will continue to monitor.

## 2019-09-11 NOTE — Progress Notes (Signed)
Inpatient Diabetes Program Recommendations  AACE/ADA: New Consensus Statement on Inpatient Glycemic Control (2015)  Target Ranges:  Prepandial:   less than 140 mg/dL      Peak postprandial:   less than 180 mg/dL (1-2 hours)      Critically ill patients:  140 - 180 mg/dL   Results for Curtis Hardy, Curtis Hardy (MRN [id number]) as of 09/11/2019 11:50  Ref. Range 09/27/2019 11:38 09/23/2019 16:26 09/11/2019 20:46 09/11/2019 04:37 09/11/2019 08:36  Glucose-Capillary Latest Ref Range: 70 - 99 mg/dL 389 (H) 373 (H)  11 units NOVOLOG  321 (H)  4 units NOVOLOG  290 (H) 326 (H)  4 units NOVOLOG    Results for Curtis Hardy, Curtis Hardy (MRN [id number]) as of 09/11/2019 11:50  Ref. Range 10/02/2019 16:32  Hemoglobin A1C Latest Ref Range: 4.8 - 5.6 % 8.5 (H)    Admit with: Acute Hypoxic Resp. Failure due to Acute Covid 19 Viral Pneumonitis   History: DM  Home DM Meds: Glipizide 5 mg Daily       Humulin 70/30 Insulin 10 units Daily  Current Orders: Novolog Moderate Correction Scale/ SSI (0-15 units) TID AC + HS     Decadron stopped--Now getting Solumedrol 60 mg BID.  Only ate 10% of Breakfast this AM.    MD- Please consider the following in-hospital insulin adjustments:  1. Start Lantus 20 units Daily (~0.2 units/kg)  2. Increase the Strength and Frequency of the Novolog SSi to the Resistant scale (0-20 units) Q4 hours     --Will follow patient during hospitalization--  Ambrose Finland RN, MSN, CDE Diabetes Coordinator Inpatient Glycemic Control Team Team Pager: [phone] (8a-5p)

## 2019-09-11 NOTE — Progress Notes (Signed)
Spoke with pts grandson about his condition. Pt is AAOx3. I let him know that Mr. Curtis Hardy is still on oxygen, and will sometimes take it off due to confusion which causes his O2 sat to drop. I told him I would give him a call if there were any changes in his status.

## 2019-09-12 LAB — COMPREHENSIVE METABOLIC PANEL
ALT: 16 U/L (ref 0–44)
AST: 17 U/L (ref 15–41)
Albumin: 2.3 g/dL — ABNORMAL LOW (ref 3.5–5.0)
Alkaline Phosphatase: 112 U/L (ref 38–126)
Anion gap: 14 (ref 5–15)
BUN: 37 mg/dL — ABNORMAL HIGH (ref 8–23)
CO2: 23 mmol/L (ref 22–32)
Calcium: 8.4 mg/dL — ABNORMAL LOW (ref 8.9–10.3)
Chloride: 99 mmol/L (ref 98–111)
Creatinine, Ser: 1.07 mg/dL (ref 0.61–1.24)
GFR calc Af Amer: >60 mL/min (ref >60)
GFR calc non Af Amer: >60 mL/min (ref >60)
Glucose, Bld: 335 mg/dL — ABNORMAL HIGH (ref 70–99)
Potassium: 4 mmol/L (ref 3.5–5.1)
Sodium: 136 mmol/L (ref 135–145)
Total Bilirubin: 1 mg/dL (ref 0.3–1.2)
Total Protein: 6.9 g/dL (ref 6.5–8.1)

## 2019-09-12 LAB — CBC WITH DIFFERENTIAL/PLATELET
Abs Immature Granulocytes: 0.13 10*3/uL — ABNORMAL HIGH (ref 0.00–0.07)
Basophils Absolute: 0 10*3/uL (ref 0.0–0.1)
Basophils Relative: 0 %
Eosinophils Absolute: 0 10*3/uL (ref 0.0–0.5)
Eosinophils Relative: 0 %
HCT: 45.6 % (ref 39.0–52.0)
Hemoglobin: 15.1 g/dL (ref 13.0–17.0)
Immature Granulocytes: 1 %
Lymphocytes Relative: 4 %
Lymphs Abs: 0.5 10*3/uL — ABNORMAL LOW (ref 0.7–4.0)
MCH: 28.8 pg (ref 26.0–34.0)
MCHC: 33.1 g/dL (ref 30.0–36.0)
MCV: 86.9 fL (ref 80.0–100.0)
Monocytes Absolute: 0.4 10*3/uL (ref 0.1–1.0)
Monocytes Relative: 3 %
Neutro Abs: 12.2 10*3/uL — ABNORMAL HIGH (ref 1.7–7.7)
Neutrophils Relative %: 92 %
Platelets: 199 10*3/uL (ref 150–400)
RBC: 5.25 MIL/uL (ref 4.22–5.81)
RDW: 13.7 % (ref 11.5–15.5)
WBC: 13.3 10*3/uL — ABNORMAL HIGH (ref 4.0–10.5)
nRBC: 0 % (ref 0.0–0.2)

## 2019-09-12 LAB — PROCALCITONIN: Procalcitonin: 0.83 ng/mL

## 2019-09-12 LAB — GLUCOSE, CAPILLARY
Glucose-Capillary: 317 mg/dL — ABNORMAL HIGH (ref 70–99)
Glucose-Capillary: 338 mg/dL — ABNORMAL HIGH (ref 70–99)
Glucose-Capillary: 313 mg/dL — ABNORMAL HIGH (ref 70–99)
Glucose-Capillary: 303 mg/dL — ABNORMAL HIGH (ref 70–99)

## 2019-09-12 LAB — MAGNESIUM: Magnesium: 2.2 mg/dL (ref 1.7–2.4)

## 2019-09-12 LAB — D-DIMER, QUANTITATIVE: D-Dimer, Quant: 13.94 ug/mL-FEU — ABNORMAL HIGH (ref 0.00–0.50)

## 2019-09-12 LAB — C-REACTIVE PROTEIN: CRP: 16.1 mg/dL — ABNORMAL HIGH (ref <1.0)

## 2019-09-12 LAB — BRAIN NATRIURETIC PEPTIDE: B Natriuretic Peptide: 118.2 pg/mL — ABNORMAL HIGH (ref 0.0–100.0)

## 2019-09-12 MED ORDER — INSULIN GLARGINE 100 UNIT/ML ~~LOC~~ SOLN
15.0000 [IU] | Freq: Every day | SUBCUTANEOUS | Status: DC
  Administered 2019-09-12 – 2019-09-13 (×2): 15 [IU] via SUBCUTANEOUS
  Filled 2019-09-12 (×3): qty 0.15

## 2019-09-12 MED ORDER — FUROSEMIDE 10 MG/ML IJ SOLN
60.0000 mg | Freq: Once | INTRAMUSCULAR | Status: AC
  Administered 2019-09-12: 60 mg via INTRAVENOUS
  Filled 2019-09-12: qty 6

## 2019-09-12 MED ORDER — CHLORHEXIDINE GLUCONATE CLOTH 2 % EX PADS
6.0000 | MEDICATED_PAD | Freq: Every day | CUTANEOUS | Status: DC
  Administered 2019-09-13 – 2019-09-17 (×5): 6 via TOPICAL

## 2019-09-12 NOTE — Progress Notes (Signed)
   09/12/19 1511  Urine Characteristics  Urinary Interventions Bladder scan  Bladder Scan Volume (mL) 999 mL   Dr. Thedore Mins notified, Verbal order given to place foley

## 2019-09-12 NOTE — Progress Notes (Signed)
PROGRESS NOTE                                                                                                                                                                                                             Patient Demographics:    Curtis Hardy, is a 74 y.o. male, DOB - 1945-09-24, ZOX:[id number]  Outpatient Primary MD for the patient is Patient, No Pcp Per    LOS - 2  Admit date - 09/26/2019    Chief Complaint  Patient presents with   Shortness of Breath       Brief Narrative  Curtis Hardy is a 74 y.o. male with medical history significant of new onset A. Fib-on Eliquis, diabetes mellitus presents to emergency department due to worsening of shortness of breath.  Patient recently admitted on 03/19 at [facility] due to COVID-19, new onset A. fib and fluid overload.  He was diuresed and received dexamethasone and remdesivir while he was hospitalized.  His CHA2DS2-VASc score was 3.  Patient discharged home on Eliquis, dexamethasone.  He finished dexamethasone on 09/07/19.  ER at [facility] he was found to have severe hypoxic respiratory failure requiring 15 L nasal cannula oxygen and was admitted to the hospital.   Subjective:   Patient in bed, appears comfortable, denies any headache, no fever, no chest pain or pressure, ++ shortness of breath , no abdominal pain. No focal weakness.    Assessment  & Plan :     1. Acute Hypoxic Resp. Failure due to Acute Covid 19 Viral Pneumonitis during the ongoing 2020 Covid 19 Pandemic - he has severe parenchymal injury, his symptoms have been present for at least 7 to 10 days if not more.  He has been started on IV steroids high-dose, he has already finished remdesivir during his previous hospitalization at [facility] recently, received Actemra after consent early on 09/11/2019.  Still has tremendous work of breathing and is quite short of breath, he is  currently on 15 L nonrebreather on 09/12/2019 and I will transition him to heated high flow to provide him with less work of breathing and symptomatic relief.  He has pretty advanced parenchymal disease and we are several days into work and remains extremely tenuous.  Encouraged the patient to sit up in chair in the daytime use I-S  and flutter valve for pulmonary toiletry and then prone in bed when at night.  Will advance activity and titrate down oxygen as possible.  SpO2: 92 % O2 Flow Rate (L/min): 15 L/min  Recent Labs  Lab 09/19/2019 1146 09/11/19 0554 09/12/19 0351  WBC 13.8* 11.5* 13.3*  HGB 15.4 15.3 15.1  HCT 47.1 45.1 45.6  PLT 157 157 199  MCV 88.9 86.4 86.9  MCH 29.1 29.3 28.8  MCHC 32.7 33.9 33.1  RDW 13.5 13.5 13.7  LYMPHSABS  --  0.4* 0.5*  MONOABS  --  0.3 0.4  EOSABS  --  0.0 0.0  BASOSABS  --  0.0 0.0    Recent Labs  Lab 10/09/2019 1146 09/30/2019 1250 09/19/2019 1632 09/11/19 0554 09/12/19 0351  NA 138  --   --  135 136  K 3.7  --   --  3.5 4.0  CL 100  --   --  97* 99  CO2 20*  --   --  22 23  GLUCOSE 284*  --   --  302* 335*  BUN 14  --   --  17 37*  CREATININE 0.87  --   --  0.84 1.07  CALCIUM 8.4*  --   --  8.3* 8.4*  AST 27  --   --  28 17  ALT 15  --   --  16 16  ALKPHOS 103  --   --  103 112  BILITOT 2.0*  --   --  1.3* 1.0  ALBUMIN 2.6*  --   --  2.3* 2.3*  MG  --   --   --  1.7 2.2  CRP  --  23.9*  --  27.0* 16.1*  DDIMER  --  15.37*  --  >20.00* 13.94*  PROCALCITON  --  0.24  --  0.71 0.83  HGBA1C  --   --  8.5*  --   --   BNP 148.5*  --   --  181.2* 118.2*    Recent Labs  Lab 09/30/2019 1146 09/22/2019 1250 09/11/19 0554 09/12/19 0351  CRP  --  23.9* 27.0* 16.1*  DDIMER  --  15.37* >20.00* 13.94*  BNP 148.5*  --  181.2* 118.2*  PROCALCITON  --  0.24 0.71 0.83         2.  Elevated D-dimer.  CTA negative for PE and lower extremity venous duplex negative for DVT however he is at very high risk of developing a blood clot due to intense  inflammation, continue full dose Lovenox till D-dimer is persistently dropping and inflammation is well controlled (he needs anticoagulation for A. fib as well).  3.  Recent paroxysmal A. fib.  Placed on low-dose oral Cardizem, [place] vas 2 score is greater than 2 and was started on Eliquis at [facility], for now full dose Lovenox and monitor.  4.  Acute on chronic diastolic CHF.  EF 55% on recent echocardiogram at [facility].  Likely due to RVR.  Gentle Lasix and monitor.    Condition - Extremely Guarded  Family Communication  :  Bonner Puna - [phone] -  09/11/19, 09/12/19 - clearly explained prognosis is extremely GUARDED.  Code Status :  Full  Diet :   Diet Order            Diet 2 gram sodium Room service appropriate? Yes; Fluid consistency: Thin  Diet effective now  Disposition Plan  :  Stay in the Hospital for severe Hypoxic Resp failure   Consults  :  None  Procedures  :    Outside TTE 03/21 -   Left ventricular systolic function is normal.  LV ejection fraction = 55-60%.  No segmental wall motion abnormalities seen in the left ventricle  Left ventricular filling pattern is indeterminate.  The right ventricular systolic function is normal.  There is mild aortic stenosis.  IVC size was mildly dilated.  There is no comparison study available.     CTA - No PE  Leg US - No DVT  PUD Prophylaxis :    DVT Prophylaxis  :  Lovenox   Lab Results  Component Value Date   PLT 199 09/12/2019    Inpatient Medications  Scheduled Meds:  vitamin C  500 mg Oral Daily   diltiazem  90 mg Oral Q8H   enoxaparin (LOVENOX) injection  115 mg Subcutaneous Q12H   furosemide  60 mg Intravenous Once   insulin aspart  0-15 Units Subcutaneous TID WC   insulin aspart  0-5 Units Subcutaneous QHS   insulin glargine  15 Units Subcutaneous Daily   methylPREDNISolone (SOLU-MEDROL) injection  60 mg Intravenous Q12H   zinc sulfate  220 mg  Oral Daily   Continuous Infusions:  doxycycline (VIBRAMYCIN) IV 100 mg (09/12/19 0231)   PRN Meds:.acetaminophen, chlorpheniramine-HYDROcodone, guaiFENesin-dextromethorphan, hydrALAZINE, Ipratropium-Albuterol, [DISCONTINUED] ondansetron **OR** ondansetron (ZOFRAN) IV, sodium chloride  Antibiotics  :    Anti-infectives (From admission, onward)   Start     Dose/Rate Route Frequency Ordered Stop   09/11/19 1000  remdesivir 100 mg in sodium chloride 0.9 % 100 mL IVPB  Status:  Discontinued     100 mg 200 mL/hr over 30 Minutes Intravenous Daily 09/17/2019 1349 09/28/2019 1359   09/11/19 1000  remdesivir 100 mg in sodium chloride 0.9 % 100 mL IVPB  Status:  Discontinued     100 mg 200 mL/hr over 30 Minutes Intravenous Daily 10/03/2019 1402 10/06/2019 1518   10/07/2019 1530  doxycycline (VIBRAMYCIN) 100 mg in sodium chloride 0.9 % 250 mL IVPB     100 mg 125 mL/hr over 120 Minutes Intravenous Every 12 hours 09/23/2019 1501     09/17/2019 1500  remdesivir 200 mg in sodium chloride 0.9% 250 mL IVPB  Status:  Discontinued     200 mg 580 mL/hr over 30 Minutes Intravenous Once 09/25/2019 1402 09/30/2019 1518   10/09/2019 1400  remdesivir 200 mg in sodium chloride 0.9% 250 mL IVPB  Status:  Discontinued     200 mg 580 mL/hr over 30 Minutes Intravenous Once 09/28/2019 1349 09/22/2019 1359       Time Spent in minutes  30   Susa RaringPrashant Kinta Martis M.D on 09/12/2019 at 9:26 AM  To page go to [email or web address] - password [email or web address]  [facility] -  Office  [phone]    See all Orders from today for further details    Objective:   Vitals:   09/12/19 0226 09/12/19 0453 09/12/19 0800 09/12/19 0922  BP: 132/88 (!) 118/58 129/72 129/72  Pulse: 88 93 85 91  Resp: 20 19 20  (!) 28  Temp:  97.8 F (36.6 C) 97.6 F (36.4 C)   TempSrc:  Axillary Oral   SpO2: 93% 95% 94% 92%  Weight:  115.2 kg    Height:        Wt Readings from Last 3 Encounters:  09/12/19 115.2 kg     Intake/Output Summary (Last 24 hours)  at 09/12/2019  0926 Last data filed at 09/11/2019 1525 Gross per 24 hour  Intake 250 ml  Output --  Net 250 ml     Physical Exam  Awake Alert, No new F.N deficits, ++ work of breathing .AT,PERRAL Supple Neck,No JVD, No cervical lymphadenopathy appriciated.  Symmetrical Chest wall movement, Good air movement bilaterally, few rales RRR,No Gallops, Rubs or new Murmurs, No Parasternal Heave +ve B.Sounds, Abd Soft, No tenderness, No organomegaly appriciated, No rebound - guarding or rigidity. No Cyanosis, Clubbing or edema, No new Rash or bruise      Data Review:    CBC Recent Labs  Lab 09/22/2019 1146 09/11/19 0554 09/12/19 0351  WBC 13.8* 11.5* 13.3*  HGB 15.4 15.3 15.1  HCT 47.1 45.1 45.6  PLT 157 157 199  MCV 88.9 86.4 86.9  MCH 29.1 29.3 28.8  MCHC 32.7 33.9 33.1  RDW 13.5 13.5 13.7  LYMPHSABS  --  0.4* 0.5*  MONOABS  --  0.3 0.4  EOSABS  --  0.0 0.0  BASOSABS  --  0.0 0.0    Chemistries  Recent Labs  Lab 09/26/2019 1146 09/17/2019 1632 09/11/19 0554 09/12/19 0351  NA 138  --  135 136  K 3.7  --  3.5 4.0  CL 100  --  97* 99  CO2 20*  --  22 23  GLUCOSE 284*  --  302* 335*  BUN 14  --  17 37*  CREATININE 0.87  --  0.84 1.07  CALCIUM 8.4*  --  8.3* 8.4*  AST 27  --  28 17  ALT 15  --  16 16  ALKPHOS 103  --  103 112  BILITOT 2.0*  --  1.3* 1.0  MG  --   --  1.7 2.2  HGBA1C  --  8.5*  --   --      ------------------------------------------------------------------------------------------------------------------ Recent Labs    10/04/2019 1250  TRIG 84    Lab Results  Component Value Date   HGBA1C 8.5 (H) 10/06/2019   ------------------------------------------------------------------------------------------------------------------ No results for input(s): TSH, T4TOTAL, T3FREE, THYROIDAB in the last 72 hours.  Invalid input(s): FREET3  Cardiac Enzymes No results for input(s): CKMB, TROPONINI, MYOGLOBIN in the last 168 hours.  Invalid input(s):  CK ------------------------------------------------------------------------------------------------------------------    Component Value Date/Time   BNP 118.2 (H) 09/12/2019 0351    Micro Results Recent Results (from the past 240 hour(s))  MRSA PCR Screening     Status: None   Collection Time: 09/11/19  4:30 AM   Specimen: Nasal Mucosa; Nasopharyngeal  Result Value Ref Range Status   MRSA by PCR NEGATIVE NEGATIVE Final    Comment:        The GeneXpert MRSA Assay (FDA approved for NASAL specimens only), is one component of a comprehensive MRSA colonization surveillance program. It is not intended to diagnose MRSA infection nor to guide or monitor treatment for MRSA infections. Performed at [facility], [place] [address], [place],  [address]     Radiology Reports CT ANGIO CHEST PE W OR WO CONTRAST  Result Date: 09/11/2019 CLINICAL DATA:  Shortness of breath.  COVID-19 positive EXAM: CT ANGIOGRAPHY CHEST WITH CONTRAST TECHNIQUE: Multidetector CT imaging of the chest was performed using the standard protocol during bolus administration of intravenous contrast. Multiplanar CT image reconstructions and MIPs were obtained to evaluate the vascular anatomy. CONTRAST:  2mL OMNIPAQUE IOHEXOL 350 MG/ML SOLN COMPARISON:  Chest radiograph September 10, 2019 FINDINGS: Cardiovascular: There is no demonstrable  pulmonary embolus. There is no thoracic aortic aneurysm or dissection. There are scattered foci of great vessel calcification. There is aortic atherosclerosis. There are multiple foci of coronary artery calcification. There is no pericardial effusion or pericardial thickening. Mediastinum/Nodes: There is a 6 mm nodular opacity in the left lobe of the thyroid which per consensus guidelines does not warrant additional imaging surveillance. Thyroid otherwise appears unremarkable. There is a 1.4 x 1.1 cm subcarinal lymph node. Elsewhere there are scattered subcentimeter mediastinal lymph nodes  which do not meet size criteria for pathologic significance. No esophageal lesions are appreciable. Lungs/Pleura: There is widespread airspace opacity throughout the lungs diffusely with involvement of most lobes in segments. There is mild fibrotic change in the apices. No appreciable pleural effusions. Upper Abdomen: In the visualized upper abdomen, there is aortic atherosclerosis. Visualized upper abdominal structures otherwise appear unremarkable. Musculoskeletal: There is degenerative change in the mid and lower thoracic spine with diffuse idiopathic skeletal hyperostosis. There old healed rib fractures on the left with remodeling. No blastic or lytic bone lesions. No evident chest wall lesions. Review of the MIP images confirms the above findings. IMPRESSION: 1. No demonstrable pulmonary embolus. No thoracic aortic aneurysm or dissection. There is aortic atherosclerosis as well as foci of great vessel and coronary artery calcification. 2. Widespread airspace opacity throughout the lungs diffusely consistent with widespread multifocal pneumonia. Suspect atypical organism pneumonia, particularly given history. 3. Mildly prominent subcarinal lymph node which may well be of reactive etiology given the extensive lung parenchymal abnormality. No other adenopathy by size criteria. 4.  Diffuse idiopathic skeletal hyperostosis. Aortic Atherosclerosis (ICD10-I70.0). Electronically Signed   By: Bretta Bang III M.D.   On: 09/11/2019 10:00   DG Chest Port 1 View  Result Date: 09-19-19 CLINICAL DATA:  Shortness breath with COVID-19 positive status EXAM: PORTABLE CHEST 1 VIEW COMPARISON:  None. FINDINGS: Extensive airspace opacity is noted throughout the right lung as well as in the left lower lung region. Consolidation is noted in portions of the right lower lobe. Heart size and pulmonary vascularity within normal limits. No adenopathy. There are multiple old healed rib fractures on the left. There is degenerative  change in each shoulder. IMPRESSION: Multifocal airspace opacity, somewhat more pronounced on the right than on the left with consolidation in the right base. Appearance consistent with multifocal pneumonia. There may be a degree of underlying parenchymal fibrotic type change. Heart size within normal limits. No adenopathy evident. Multiple old healed rib fractures on the left. Electronically Signed   By: Bretta Bang III M.D.   On: 2019/09/19 13:00   VAS [place] LOWER EXTREMITY VENOUS (DVT)  Result Date: 09/11/2019  Lower Venous DVTStudy Indications: Swelling.  Comparison Study: No prior exam. Performing Technologist: Kennedy Bucker ARDMS, RVT  Examination Guidelines: A complete evaluation includes B-mode imaging, spectral Doppler, color Doppler, and power Doppler as needed of all accessible portions of each vessel. Bilateral testing is considered an integral part of a complete examination. Limited examinations for reoccurring indications may be performed as noted. The reflux portion of the exam is performed with the patient in reverse Trendelenburg.  +---------+---------------+---------+-----------+----------+--------------+  RIGHT     Compressibility Phasicity Spontaneity Properties Thrombus Aging  +---------+---------------+---------+-----------+----------+--------------+  CFV       Full            Yes       Yes                                    +---------+---------------+---------+-----------+----------+--------------+  SFJ       Full                                                             +---------+---------------+---------+-----------+----------+--------------+  FV Prox   Full                                                             +---------+---------------+---------+-----------+----------+--------------+  FV Mid    Full                                                             +---------+---------------+---------+-----------+----------+--------------+  FV Distal Full                                                              +---------+---------------+---------+-----------+----------+--------------+  PFV       Full                                                             +---------+---------------+---------+-----------+----------+--------------+  POP       Full            Yes       Yes                                    +---------+---------------+---------+-----------+----------+--------------+  PTV       Full                                                             +---------+---------------+---------+-----------+----------+--------------+  PERO      Full                                                             +---------+---------------+---------+-----------+----------+--------------+   +---------+---------------+---------+-----------+----------+--------------+  LEFT      Compressibility Phasicity Spontaneity Properties Thrombus Aging  +---------+---------------+---------+-----------+----------+--------------+  CFV       Full            Yes       Yes                                    +---------+---------------+---------+-----------+----------+--------------+  SFJ       Full                                                             +---------+---------------+---------+-----------+----------+--------------+  FV Prox   Full                                                             +---------+---------------+---------+-----------+----------+--------------+  FV Mid    Full                                                             +---------+---------------+---------+-----------+----------+--------------+  FV Distal Full                                                             +---------+---------------+---------+-----------+----------+--------------+  PFV       Full                                                             +---------+---------------+---------+-----------+----------+--------------+  POP       Full            Yes       Yes                                     +---------+---------------+---------+-----------+----------+--------------+  PTV       Full                                                             +---------+---------------+---------+-----------+----------+--------------+  PERO      Full                                                             +---------+---------------+---------+-----------+----------+--------------+     Summary: BILATERAL: - No evidence of deep vein thrombosis seen in the lower extremities, bilaterally.  RIGHT: - Hypoechoic, complex structure with no blood flow visualized in popliteal fossa measuring 3.7 x 1.5 x 3.3 cm.  LEFT: - No cystic structure found in the popliteal fossa.  *See table(s) above for measurements  and observations.    Preliminary

## 2019-09-12 NOTE — Evaluation (Signed)
Physical Therapy Evaluation Patient Details Name: Curtis Hardy MRN: [id number] DOB: [birth year] Today's Date: 09/12/2019   History of Present Illness  Pt adm with acute hypoxic resp failure due to covid pneumonitis. Pt with recent adm and dc at [facility] for covid. Pt also with new onset afib. PMH - chf, dm, obesity  Clinical Impression  Pt admitted with above diagnosis and presents to PT with functional limitations due to deficits listed below (See PT problem list). Pt needs skilled PT to maximize independence and safety to allow discharge to SNF prior to return home.      Follow Up Recommendations SNF;Supervision/Assistance - 24 hour    Equipment Recommendations  Other (comment)(To be determined)    Recommendations for Other Services       Precautions / Restrictions Precautions Precautions: Fall;Other (comment) Precaution Comments: watch SpO2      Mobility  Bed Mobility               General bed mobility comments: Pt up in chair  Transfers Overall transfer level: Needs assistance Equipment used: Rolling walker (2 wheeled);None Transfers: Sit to/from Stand Sit to Stand: Min assist         General transfer comment: Assist to bring hips up and for balance. Verbal cues for hand placement  Ambulation/Gait Ambulation/Gait assistance: Min assist Gait Distance (Feet): 2 Feet(forward and back) Assistive device: Rolling walker (2 wheeled) Gait Pattern/deviations: Step-through pattern;Decreased step length - right;Decreased step length - left;Shuffle Gait velocity: decr Gait velocity interpretation: <1.31 ft/sec, indicative of household ambulator General Gait Details: Assist for balance and support. Distance limited by pt on heated HFNC  Stairs            Wheelchair Mobility    Modified Rankin (Stroke Patients Only)       Balance Overall balance assessment: Needs assistance Sitting-balance support: No upper extremity supported;Feet supported Sitting  balance-Leahy Scale: Fair     Standing balance support: Single extremity supported;Bilateral upper extremity supported Standing balance-Leahy Scale: Poor Standing balance comment: UE support and min assist for static standing                             Pertinent Vitals/Pain Pain Assessment: No/denies pain    Home Living Family/patient expects to be discharged to:: Private residence Living Arrangements: Alone   Type of Home: House Home Access: Stairs to enter Entrance Stairs-Rails: Right Entrance Stairs-Number of Steps: 2 Home Layout: One level   Additional Comments: Thinks he may have some walkers at home    Prior Function Level of Independence: Independent         Comments: works in Electronics engineer   Dominant Hand: Right    Extremity/Trunk Assessment   Upper Extremity Assessment Upper Extremity Assessment: Defer to OT evaluation    Lower Extremity Assessment Lower Extremity Assessment: Generalized weakness       Communication   Communication: No difficulties  Cognition Arousal/Alertness: Awake/alert Behavior During Therapy: WFL for tasks assessed/performed Overall Cognitive Status: Within Functional Limits for tasks assessed                                        General Comments General comments (skin integrity, edema, etc.): Pt on 20L HFNC. SpO2 low 80's with activity. Recovered to >90% with seated rest x 1 minute.    Exercises  General Exercises - Lower Extremity Ankle Circles/Pumps: AROM;Both;10 reps;Seated Quad Sets: Strengthening;Both;10 reps;Seated Long Arc Quad: Strengthening;Both;10 reps;Seated Other Exercises Other Exercises: Incentive spirometer x 4   Assessment/Plan    PT Assessment Patient needs continued PT services  PT Problem List Decreased strength;Decreased activity tolerance;Decreased balance;Decreased mobility;Cardiopulmonary status limiting activity       PT Treatment  Interventions DME instruction;Gait training;Functional mobility training;Therapeutic activities;Therapeutic exercise;Balance training;Patient/family education    PT Goals (Current goals can be found in the Care Plan section)  Acute Rehab PT Goals Patient Stated Goal: return home PT Goal Formulation: With patient Time For Goal Achievement: 09/26/19 Potential to Achieve Goals: Good Additional Goals Additional Goal #1: Maintain SpO2 >88% with activity    Frequency Min 3X/week   Barriers to discharge Decreased caregiver support Lives alone    Co-evaluation               AM-PAC PT "6 Clicks" Mobility  Outcome Measure Help needed turning from your back to your side while in a flat bed without using bedrails?: A Little Help needed moving from lying on your back to sitting on the side of a flat bed without using bedrails?: A Little Help needed moving to and from a bed to a chair (including a wheelchair)?: A Little Help needed standing up from a chair using your arms (e.g., wheelchair or bedside chair)?: A Little Help needed to walk in hospital room?: A Little Help needed climbing 3-5 steps with a railing? : A Lot 6 Click Score: 17    End of Session Equipment Utilized During Treatment: Oxygen Activity Tolerance: Patient limited by fatigue;Treatment limited secondary to medical complications (Comment)(SpO2) Patient left: in chair;with call bell/phone within reach   PT Visit Diagnosis: Unsteadiness on feet (R26.81);Other abnormalities of gait and mobility (R26.89);Muscle weakness (generalized) (M62.81)    Time: 8676-7209 PT Time Calculation (min) (ACUTE ONLY): 22 min   Charges:   PT Evaluation $PT Eval Moderate Complexity: 1 Mod          [facility] PT Acute Rehabilitation Services Pager [phone] Office [phone]   Angelina Ok [facility] 09/12/2019, 2:09 PM

## 2019-09-12 NOTE — Significant Event (Addendum)
Rapid Response Event Note  I was rounding on 5W and just checking on patients and came by to see the patient. Upon arrival, patient was sitting in the chair, resting comfortably. Patient was alert, oriented, and seems to be breathing fairly comfortably. Mild increased work of breathing - mild accessory muscle use at times. 98% on HHFNC 100% 20L -- good respiratory effort. VSS.    Plan: -- Wean oxygen as patient tolerates -- Encourage OOB, proning, and PT.   Start Time 1215 End Time 1235   Gregory Dowe R

## 2019-09-12 NOTE — Progress Notes (Signed)
   09/12/19 1600  Family/Significant Other Communication  Family/Significant Other Update Called  updated grandson [name] on pts status and plan of care.

## 2019-09-13 LAB — COMPREHENSIVE METABOLIC PANEL
ALT: 17 U/L (ref 0–44)
AST: 14 U/L — ABNORMAL LOW (ref 15–41)
Albumin: 2.4 g/dL — ABNORMAL LOW (ref 3.5–5.0)
Alkaline Phosphatase: 109 U/L (ref 38–126)
Anion gap: 13 (ref 5–15)
BUN: 51 mg/dL — ABNORMAL HIGH (ref 8–23)
CO2: 25 mmol/L (ref 22–32)
Calcium: 8.4 mg/dL — ABNORMAL LOW (ref 8.9–10.3)
Chloride: 99 mmol/L (ref 98–111)
Creatinine, Ser: 0.97 mg/dL (ref 0.61–1.24)
GFR calc Af Amer: >60 mL/min (ref >60)
GFR calc non Af Amer: >60 mL/min (ref >60)
Glucose, Bld: 290 mg/dL — ABNORMAL HIGH (ref 70–99)
Potassium: 3.9 mmol/L (ref 3.5–5.1)
Sodium: 137 mmol/L (ref 135–145)
Total Bilirubin: 1.1 mg/dL (ref 0.3–1.2)
Total Protein: 6.8 g/dL (ref 6.5–8.1)

## 2019-09-13 LAB — CBC WITH DIFFERENTIAL/PLATELET
Abs Immature Granulocytes: 0.1 10*3/uL — ABNORMAL HIGH (ref 0.00–0.07)
Basophils Absolute: 0 10*3/uL (ref 0.0–0.1)
Basophils Relative: 0 %
Eosinophils Absolute: 0 10*3/uL (ref 0.0–0.5)
Eosinophils Relative: 0 %
HCT: 46.9 % (ref 39.0–52.0)
Hemoglobin: 15.3 g/dL (ref 13.0–17.0)
Immature Granulocytes: 1 %
Lymphocytes Relative: 3 %
Lymphs Abs: 0.4 10*3/uL — ABNORMAL LOW (ref 0.7–4.0)
MCH: 28.7 pg (ref 26.0–34.0)
MCHC: 32.6 g/dL (ref 30.0–36.0)
MCV: 88 fL (ref 80.0–100.0)
Monocytes Absolute: 0.3 10*3/uL (ref 0.1–1.0)
Monocytes Relative: 2 %
Neutro Abs: 11.6 10*3/uL — ABNORMAL HIGH (ref 1.7–7.7)
Neutrophils Relative %: 94 %
Platelets: 220 10*3/uL (ref 150–400)
RBC: 5.33 MIL/uL (ref 4.22–5.81)
RDW: 13.6 % (ref 11.5–15.5)
WBC: 12.4 10*3/uL — ABNORMAL HIGH (ref 4.0–10.5)
nRBC: 0 % (ref 0.0–0.2)

## 2019-09-13 LAB — BRAIN NATRIURETIC PEPTIDE: B Natriuretic Peptide: 109.8 pg/mL — ABNORMAL HIGH (ref 0.0–100.0)

## 2019-09-13 LAB — MAGNESIUM: Magnesium: 2.4 mg/dL (ref 1.7–2.4)

## 2019-09-13 LAB — D-DIMER, QUANTITATIVE: D-Dimer, Quant: 3.86 ug/mL-FEU — ABNORMAL HIGH (ref 0.00–0.50)

## 2019-09-13 LAB — PROCALCITONIN: Procalcitonin: 0.26 ng/mL

## 2019-09-13 LAB — GLUCOSE, CAPILLARY
Glucose-Capillary: 264 mg/dL — ABNORMAL HIGH (ref 70–99)
Glucose-Capillary: 347 mg/dL — ABNORMAL HIGH (ref 70–99)
Glucose-Capillary: 346 mg/dL — ABNORMAL HIGH (ref 70–99)
Glucose-Capillary: 383 mg/dL — ABNORMAL HIGH (ref 70–99)

## 2019-09-13 LAB — C-REACTIVE PROTEIN: CRP: 6.6 mg/dL — ABNORMAL HIGH (ref <1.0)

## 2019-09-13 MED ORDER — METOPROLOL TARTRATE 50 MG PO TABS
50.0000 mg | ORAL_TABLET | Freq: Two times a day (BID) | ORAL | Status: DC
  Administered 2019-09-13 – 2019-09-17 (×9): 50 mg via ORAL
  Filled 2019-09-13 (×9): qty 1

## 2019-09-13 MED ORDER — PRO-STAT SUGAR FREE PO LIQD
30.0000 mL | Freq: Three times a day (TID) | ORAL | Status: DC
  Administered 2019-09-13 – 2019-09-17 (×10): 30 mL via ORAL
  Filled 2019-09-13 (×11): qty 30

## 2019-09-13 MED ORDER — TAMSULOSIN HCL 0.4 MG PO CAPS
0.4000 mg | ORAL_CAPSULE | Freq: Every day | ORAL | Status: DC
  Administered 2019-09-13 – 2019-09-17 (×5): 0.4 mg via ORAL
  Filled 2019-09-13 (×5): qty 1

## 2019-09-13 MED ORDER — WHITE PETROLATUM EX OINT
TOPICAL_OINTMENT | CUTANEOUS | Status: AC
  Filled 2019-09-13: qty 28.35

## 2019-09-13 MED ORDER — GLUCERNA SHAKE PO LIQD
237.0000 mL | Freq: Three times a day (TID) | ORAL | Status: DC
  Administered 2019-09-13 – 2019-09-16 (×8): 237 mL via ORAL

## 2019-09-13 MED ORDER — APIXABAN 5 MG PO TABS
5.0000 mg | ORAL_TABLET | Freq: Two times a day (BID) | ORAL | Status: DC
  Administered 2019-09-14 – 2019-09-16 (×6): 5 mg via ORAL
  Filled 2019-09-13 (×6): qty 1

## 2019-09-13 NOTE — Progress Notes (Addendum)
PROGRESS NOTE                                                                                                                                                                                                             Patient Demographics:    Curtis Hardy, is a 74 y.o. male, DOB - 06/01/1946, ZOX:[id number]  Outpatient Primary MD for the patient is Curtis Hardy    LOS - 3  Admit date - September 29, 2019    Chief Complaint  Patient presents with  . Shortness of Breath       Brief Narrative  Curtis Hardy is a 74 y.o. male with medical history significant of new onset A. Fib-on Eliquis, diabetes mellitus presents to emergency department due to worsening of shortness of breath.  Patient recently admitted on 03/19 at [facility] due to COVID-19, new onset A. fib and fluid overload.  He was diuresed and received dexamethasone and remdesivir while he was hospitalized.  His CHA2DS2-VASc score was 3.  Patient discharged home on Eliquis, dexamethasone.  He finished dexamethasone on 09/07/19.  ER at [facility] he was found to have severe hypoxic respiratory failure requiring 15 L nasal cannula oxygen and was admitted to the hospital.   Subjective:   Patient in bed, appears comfortable, denies any headache, no fever, no chest pain or pressure, improved shortness of breath , no abdominal pain. No focal weakness.   Assessment  & Plan :     1. Acute Hypoxic Resp. Failure due to Acute Covid 19 Viral Pneumonitis during the ongoing 2020 Covid 19 Pandemic - he has severe parenchymal injury, his symptoms have been present for at least 7 to 10 days if not more.  He has been started on IV steroids high-dose, he has already finished remdesivir during his previous hospitalization at [facility] recently, received Actemra after consent early on 09/11/2019.     Unfortunately he has developed severe parenchymal lung injury, was  requiring 15 L oxygen plus nonrebreather mask and was transitioned to 20 L heated high flow on 09/12/2019 at 100% FiO2, on 09/13/2019 he is still on 20 L heated high flow but has been brought down to 70% FiO2.  Still extremely tenuous and will be closely monitored.    Encouraged the patient to sit up in chair in the  daytime use I-S and flutter valve for pulmonary toiletry and then prone in bed when at night.  Will advance activity and titrate down oxygen as possible.  SpO2: 91 % O2 Flow Rate (L/min): 20 L/min FiO2 (%): 70 %  Recent Labs  Lab 10/05/19 1146 09/11/19 0554 09/12/19 0351 09/13/19 0627  WBC 13.8* 11.5* 13.3* 12.4*  HGB 15.4 15.3 15.1 15.3  HCT 47.1 45.1 45.6 46.9  PLT 157 157 199 220  MCV 88.9 86.4 86.9 88.0  MCH 29.1 29.3 28.8 28.7  MCHC 32.7 33.9 33.1 32.6  RDW 13.5 13.5 13.7 13.6  LYMPHSABS  --  0.4* 0.5* 0.4*  MONOABS  --  0.3 0.4 0.3  EOSABS  --  0.0 0.0 0.0  BASOSABS  --  0.0 0.0 0.0    Recent Labs  Lab 2019/10/05 1146 10-05-19 1250 05-Oct-2019 1632 09/11/19 0554 09/12/19 0351 09/13/19 0627  NA 138  --   --  135 136 137  K 3.7  --   --  3.5 4.0 3.9  CL 100  --   --  97* 99 99  CO2 20*  --   --  GLUCOSE 284*  --   --  302* 335* 290*  BUN 14  --   --  17 37* 51*  CREATININE 0.87  --   --  0.84 1.07 0.97  CALCIUM 8.4*  --   --  8.3* 8.4* 8.4*  AST 27  --   --  28 17 14*  ALT 15  --   --  ALKPHOS 103  --   --  103 112 109  BILITOT 2.0*  --   --  1.3* 1.0 1.1  ALBUMIN 2.6*  --   --  2.3* 2.3* 2.4*  MG  --   --   --  1.7 2.2 2.4  CRP  --  23.9*  --  27.0* 16.1* 6.6*  DDIMER  --  15.37*  --  >20.00* 13.94* 3.86*  PROCALCITON  --  0.24  --  0.71 0.83 0.26  HGBA1C  --   --  8.5*  --   --   --   BNP 148.5*  --   --  181.2* 118.2* 109.8*    Recent Labs  Lab October 05, 2019 1146 2019-10-05 1250 09/11/19 0554 09/12/19 0351 09/13/19 0627  CRP  --  23.9* 27.0* 16.1* 6.6*  DDIMER  --  15.37* >20.00* 13.94* 3.86*  BNP 148.5*  --  181.2* 118.2*  109.8*  PROCALCITON  --  0.24 0.71 0.83 0.26        2.  Elevated D-dimer.  CTA negative for PE and lower extremity venous duplex negative for DVT however he is at very high risk of developing a blood clot due to intense inflammation, continue full dose Lovenox till D-dimer is persistently dropping and inflammation is well controlled (he needs anticoagulation for A. fib as well).  3.  Recent paroxysmal A. fib.  Placed on low-dose oral Cardizem, [place] vas 2 score is greater than 2 and was started on Eliquis at [facility], for now full dose Lovenox and monitor.  4.  Acute on chronic diastolic CHF.  EF 55% on recent echocardiogram at [facility].  Likely due to RVR.  Gentle Lasix and monitor.  5.  Reduced oral intake.  Poor appetite, placed on Glucerna shakes and Prozac.  Encouraged to eat.  6. Urinary retention - Foley + Flomax.  Condition - Extremely Guarded  Family Communication  :  Curtis Hardy - [phone] -  09/11/19, 09/12/19, 09/13/19 - clearly explained prognosis is extremely GUARDED.  Code Status :  Full  Diet :   Diet Order            DIET SOFT Room service appropriate? Yes; Fluid consistency: Thin  Diet effective now               Disposition Plan  :  Stay in the Hospital for severe Hypoxic Resp failure   Consults  :  None  Procedures  :    Outside TTE 03/21 -   Left ventricular systolic function is normal.  LV ejection fraction = 55-60%.  No segmental wall motion abnormalities seen in the left ventricle  Left ventricular filling pattern is indeterminate.  The right ventricular systolic function is normal.  There is mild aortic stenosis.  IVC size was mildly dilated.  There is no comparison study available.     CTA - No PE  Leg [place] - No DVT  PUD Prophylaxis :    DVT Prophylaxis  :  Lovenox   Lab Results  Component Value Date   PLT 220 09/13/2019    Inpatient Medications  Scheduled Meds: . [START ON 09/14/2019] apixaban  5 mg  Oral BID  . vitamin C  500 mg Oral Daily  . Chlorhexidine Gluconate Cloth  6 each Topical Daily  . enoxaparin (LOVENOX) injection  115 mg Subcutaneous Q12H  . feeding supplement (GLUCERNA SHAKE)  237 mL Oral TID BM  . feeding supplement (PRO-STAT SUGAR FREE 64)  30 mL Oral TID WC  . insulin aspart  0-15 Units Subcutaneous TID WC  . insulin aspart  0-5 Units Subcutaneous QHS  . insulin glargine  15 Units Subcutaneous Daily  . methylPREDNISolone (SOLU-MEDROL) injection  60 mg Intravenous Q12H  . metoprolol tartrate  50 mg Oral BID  . tamsulosin  0.4 mg Oral Daily  . zinc sulfate  220 mg Oral Daily   Continuous Infusions: . doxycycline (VIBRAMYCIN) IV 100 mg (09/13/19 0219)   PRN Meds:.acetaminophen, chlorpheniramine-HYDROcodone, guaiFENesin-dextromethorphan, hydrALAZINE, Ipratropium-Albuterol, [DISCONTINUED] ondansetron **OR** ondansetron (ZOFRAN) IV, sodium chloride  Antibiotics  :    Anti-infectives (From admission, onward)   Start     Dose/Rate Route Frequency Ordered Stop   09/11/19 1000  remdesivir 100 mg in sodium chloride 0.9 % 100 mL IVPB  Status:  Discontinued     100 mg 200 mL/hr over 30 Minutes Intravenous Daily 09-Oct-2019 1349 10-09-2019 1359   09/11/19 1000  remdesivir 100 mg in sodium chloride 0.9 % 100 mL IVPB  Status:  Discontinued     100 mg 200 mL/hr over 30 Minutes Intravenous Daily 10-09-2019 1402 10-09-19 1518   October 09, 2019 1530  doxycycline (VIBRAMYCIN) 100 mg in sodium chloride 0.9 % 250 mL IVPB     100 mg 125 mL/hr over 120 Minutes Intravenous Every 12 hours Oct 09, 2019 1501     10-09-19 1500  remdesivir 200 mg in sodium chloride 0.9% 250 mL IVPB  Status:  Discontinued     200 mg 580 mL/hr over 30 Minutes Intravenous Once 2019-10-09 1402 09-Oct-2019 1518   2019-10-09 1400  remdesivir 200 mg in sodium chloride 0.9% 250 mL IVPB  Status:  Discontinued     200 mg 580 mL/hr over 30 Minutes Intravenous Once 10-09-19 1349 10/09/19 1359       Time Spent in minutes   30   Susa Raring M.D on 09/13/2019 at  9:27 AM  To page go to [email or web address] - password [facility]  [facility] -  Office  [phone]    See all Orders from today for further details    Objective:   Vitals:   09/13/19 0644 09/13/19 0730 09/13/19 0753 09/13/19 0835  BP:   138/71 134/73  Pulse: 85 80  84  Resp: 20 16  (!) 22  Temp:   (!) 97.3 F (36.3 C) (!) 97.3 F (36.3 C)  TempSrc:   Oral Oral  SpO2: 94% 95%  91%  Weight:      Height:        Wt Readings from Last 3 Encounters:  09/12/19 115.2 kg     Intake/Output Summary (Last 24 hours) at 09/13/2019 0927 Last data filed at 09/13/2019 0500 Gross Hardy 24 hour  Intake 240 ml  Output 2375 ml  Net -2135 ml     Physical Exam  Awake Alert, No new F.N deficits, Normal affect [place].AT,PERRAL Supple Neck,No JVD, No cervical lymphadenopathy appriciated.  Symmetrical Chest wall movement, Good air movement bilaterally, CTAB RRR,No Gallops, Rubs or new Murmurs, No Parasternal Heave +ve B.Sounds, Abd Soft, No tenderness, No organomegaly appriciated, No rebound - guarding or rigidity. No Cyanosis, Clubbing or edema, No new Rash or bruise    Data Review:    CBC Recent Labs  Lab 09/20/2019 1146 09/11/19 0554 09/12/19 0351 09/13/19 0627  WBC 13.8* 11.5* 13.3* 12.4*  HGB 15.4 15.3 15.1 15.3  HCT 47.1 45.1 45.6 46.9  PLT 157 157 199 220  MCV 88.9 86.4 86.9 88.0  MCH 29.1 29.3 28.8 28.7  MCHC 32.7 33.9 33.1 32.6  RDW 13.5 13.5 13.7 13.6  LYMPHSABS  --  0.4* 0.5* 0.4*  MONOABS  --  0.3 0.4 0.3  EOSABS  --  0.0 0.0 0.0  BASOSABS  --  0.0 0.0 0.0    Chemistries  Recent Labs  Lab 09/26/2019 1146 09/14/2019 1632 09/11/19 0554 09/12/19 0351 09/13/19 0627  NA 138  --  135 136 137  K 3.7  --  3.5 4.0 3.9  CL 100  --  97* 99 99  CO2 20*  --  22 23 25   GLUCOSE 284*  --  302* 335* 290*  BUN 14  --  17 37* 51*  CREATININE 0.87  --  0.84 1.07 0.97  CALCIUM 8.4*  --  8.3* 8.4* 8.4*  AST 27  --  28 17 14*  ALT 15  --   16 16 17   ALKPHOS 103  --  103 112 109  BILITOT 2.0*  --  1.3* 1.0 1.1  MG  --   --  1.7 2.2 2.4  HGBA1C  --  8.5*  --   --   --      ------------------------------------------------------------------------------------------------------------------ Recent Labs    09/23/2019 1250  TRIG 84    Lab Results  Component Value Date   HGBA1C 8.5 (H) 10/02/2019   ------------------------------------------------------------------------------------------------------------------ No results for input(s): TSH, T4TOTAL, T3FREE, THYROIDAB in the last 72 hours.  Invalid input(s): FREET3  Cardiac Enzymes No results for input(s): CKMB, TROPONINI, MYOGLOBIN in the last 168 hours.  Invalid input(s): CK ------------------------------------------------------------------------------------------------------------------    Component Value Date/Time   BNP 109.8 (H) 09/13/2019 7341    Micro Results Recent Results (from the past 240 hour(s))  MRSA PCR Screening     Status: None   Collection Time: 09/11/19  4:30 AM   Specimen: Nasal Mucosa; Nasopharyngeal  Result Value Ref Range Status  MRSA by PCR NEGATIVE NEGATIVE Final    Comment:        The GeneXpert MRSA Assay (FDA approved for NASAL specimens only), is one component of a comprehensive MRSA colonization surveillance program. It is not intended to diagnose MRSA infection nor to guide or monitor treatment for MRSA infections. Performed at [facility], [address], [place], [place] [address]     Radiology Reports CT ANGIO CHEST PE W OR WO CONTRAST  Result Date: 09/11/2019 CLINICAL DATA:  Shortness of breath.  COVID-19 positive EXAM: CT ANGIOGRAPHY CHEST WITH CONTRAST TECHNIQUE: Multidetector CT imaging of the chest was performed using the standard protocol during bolus administration of intravenous contrast. Multiplanar CT image reconstructions and MIPs were obtained to evaluate the vascular anatomy. CONTRAST:  75mL OMNIPAQUE  IOHEXOL 350 MG/ML SOLN COMPARISON:  Chest radiograph September 10, 2019 FINDINGS: Cardiovascular: There is no demonstrable pulmonary embolus. There is no thoracic aortic aneurysm or dissection. There are scattered foci of great vessel calcification. There is aortic atherosclerosis. There are multiple foci of coronary artery calcification. There is no pericardial effusion or pericardial thickening. Mediastinum/Nodes: There is a 6 mm nodular opacity in the left lobe of the thyroid which Hardy consensus guidelines does not warrant additional imaging surveillance. Thyroid otherwise appears unremarkable. There is a 1.4 x 1.1 cm subcarinal lymph node. Elsewhere there are scattered subcentimeter mediastinal lymph nodes which do not meet size criteria for pathologic significance. No esophageal lesions are appreciable. Lungs/Pleura: There is widespread airspace opacity throughout the lungs diffusely with involvement of most lobes in segments. There is mild fibrotic change in the apices. No appreciable pleural effusions. Upper Abdomen: In the visualized upper abdomen, there is aortic atherosclerosis. Visualized upper abdominal structures otherwise appear unremarkable. Musculoskeletal: There is degenerative change in the mid and lower thoracic spine with diffuse idiopathic skeletal hyperostosis. There old healed rib fractures on the left with remodeling. No blastic or lytic bone lesions. No evident chest wall lesions. Review of the MIP images confirms the above findings. IMPRESSION: 1. No demonstrable pulmonary embolus. No thoracic aortic aneurysm or dissection. There is aortic atherosclerosis as well as foci of great vessel and coronary artery calcification. 2. Widespread airspace opacity throughout the lungs diffusely consistent with widespread multifocal pneumonia. Suspect atypical organism pneumonia, particularly given history. 3. Mildly prominent subcarinal lymph node which may well be of reactive etiology given the extensive  lung parenchymal abnormality. No other adenopathy by size criteria. 4.  Diffuse idiopathic skeletal hyperostosis. Aortic Atherosclerosis (ICD10-I70.0). Electronically Signed   By: Bretta Bang III M.D.   On: 09/11/2019 10:00   DG Chest Port 1 View  Result Date: 10/07/2019 CLINICAL DATA:  Shortness breath with COVID-19 positive status EXAM: PORTABLE CHEST 1 VIEW COMPARISON:  None. FINDINGS: Extensive airspace opacity is noted throughout the right lung as well as in the left lower lung region. Consolidation is noted in portions of the right lower lobe. Heart size and pulmonary vascularity within normal limits. No adenopathy. There are multiple old healed rib fractures on the left. There is degenerative change in each shoulder. IMPRESSION: Multifocal airspace opacity, somewhat more pronounced on the right than on the left with consolidation in the right base. Appearance consistent with multifocal pneumonia. There may be a degree of underlying parenchymal fibrotic type change. Heart size within normal limits. No adenopathy evident. Multiple old healed rib fractures on the left. Electronically Signed   By: Bretta Bang III M.D.   On: 09/13/2019 13:00   VAS [place] LOWER  EXTREMITY VENOUS (DVT)  Result Date: 09/13/2019  Lower Venous DVTStudy Indications: Swelling.  Comparison Study: No prior exam. Performing Technologist: Kennedy Bucker ARDMS, RVT  Examination Guidelines: A complete evaluation includes B-mode imaging, spectral Doppler, color Doppler, and power Doppler as needed of all accessible portions of each vessel. Bilateral testing is considered an integral part of a complete examination. Limited examinations for reoccurring indications may be performed as noted. The reflux portion of the exam is performed with the patient in reverse Trendelenburg.  +---------+---------------+---------+-----------+----------+--------------+ RIGHT    CompressibilityPhasicitySpontaneityPropertiesThrombus Aging  +---------+---------------+---------+-----------+----------+--------------+ CFV      Full           Yes      Yes                                 +---------+---------------+---------+-----------+----------+--------------+ SFJ      Full                                                        +---------+---------------+---------+-----------+----------+--------------+ FV Prox  Full                                                        +---------+---------------+---------+-----------+----------+--------------+ FV Mid   Full                                                        +---------+---------------+---------+-----------+----------+--------------+ FV DistalFull                                                        +---------+---------------+---------+-----------+----------+--------------+ PFV      Full                                                        +---------+---------------+---------+-----------+----------+--------------+ POP      Full           Yes      Yes                                 +---------+---------------+---------+-----------+----------+--------------+ PTV      Full                                                        +---------+---------------+---------+-----------+----------+--------------+ PERO     Full                                                        +---------+---------------+---------+-----------+----------+--------------+   +---------+---------------+---------+-----------+----------+--------------+  LEFT     CompressibilityPhasicitySpontaneityPropertiesThrombus Aging +---------+---------------+---------+-----------+----------+--------------+ CFV      Full           Yes      Yes                                 +---------+---------------+---------+-----------+----------+--------------+ SFJ      Full                                                         +---------+---------------+---------+-----------+----------+--------------+ FV Prox  Full                                                        +---------+---------------+---------+-----------+----------+--------------+ FV Mid   Full                                                        +---------+---------------+---------+-----------+----------+--------------+ FV DistalFull                                                        +---------+---------------+---------+-----------+----------+--------------+ PFV      Full                                                        +---------+---------------+---------+-----------+----------+--------------+ POP      Full           Yes      Yes                                 +---------+---------------+---------+-----------+----------+--------------+ PTV      Full                                                        +---------+---------------+---------+-----------+----------+--------------+ PERO     Full                                                        +---------+---------------+---------+-----------+----------+--------------+     Summary: BILATERAL: - No evidence of deep vein thrombosis seen in the lower extremities, bilaterally.  RIGHT: - Hypoechoic, complex structure with no blood flow visualized in popliteal fossa measuring 3.7 x 1.5 x 3.3 cm.  LEFT: - No cystic structure found  in the popliteal fossa.  *See table(s) above for measurements and observations. Electronically signed by Gretta Beganodd Early MD on 09/13/2019 at 8:18:24 AM.    Final

## 2019-09-13 NOTE — Progress Notes (Signed)
Occupational Therapy Evaluation (late entry)  Pt admitted with the below listed diagnosis, and demonstrates the below listed deficits.  He currently requires min A for ADLs and min A for functional transfers, but fatigues quickly and requires frequent rest breaks.  02 saturations decreased to 86% on 20L [place] 02.  He lives alone and was fully independent PTA, however, given his level of deconditioning, do not feel he is able to maintain himself independently 24 hours/day and will need SNF level rehab to regain strength and independence prior to return home.  Will follow acutely.    09/12/19 1900  OT Visit Information  Last OT Received On 09/12/19  Assistance Needed +1  History of Present Illness Pt adm with acute hypoxic resp failure due to covid pneumonitis. Pt with recent adm and dc at [facility] for covid. Pt also with new onset afib. PMH - chf, dm, obesity  Precautions  Precautions Fall;Other (comment)  Precaution Comments watch SpO2  Home Living  Family/patient expects to be discharged to: Private residence  Living Arrangements Alone  Type of Home House  Home Access Stairs to enter  Entrance Stairs-Number of Steps 2  Entrance Stairs-Rails Right  Home Layout One level  Bathroom Shower/Tub Tub/shower unit  Bathroom Toilet Handicapped height  Additional Comments Pt agreeable to SNF for rehab   Prior Function  Level of Independence Independent  Comments works in Education officer, environmental No difficulties  Pain Assessment  Pain Assessment No/denies pain  Cognition  Arousal/Alertness Awake/alert  Behavior During Therapy WFL for tasks assessed/performed  Overall Cognitive Status Within Functional Limits for tasks assessed  Upper Extremity Assessment  Upper Extremity Assessment Generalized weakness  Lower Extremity Assessment  Lower Extremity Assessment Generalized weakness  Cervical / Trunk Assessment  Cervical / Trunk Assessment Normal  ADL  Overall ADL's  Needs  assistance/impaired  Eating/Feeding Independent  Grooming Wash/dry hands;Wash/dry face;Oral care;Brushing hair;Set up;Sitting  Grooming Details (indicate cue type and reason) fatigues with standing   Upper Body Bathing Set up;Supervision/ safety;Sitting  Lower Body Bathing Minimal assistance;Sit to/from stand  Lower Body Bathing Details (indicate cue type and reason) due to fatigue   Upper Body Dressing  Minimal assistance;Sitting  Lower Body Dressing Minimal assistance;Sit to/from stand  Lower Body Dressing Details (indicate cue type and reason) able to don/doff socks, but fatigues   Toilet Transfer Stand-pivot;BSC;Minimal assistance  Toileting- Clothing Manipulation and Hygiene Sit to/from stand;Minimal assistance  Functional mobility during ADLs Minimal assistance  General ADL Comments Pt fatigues quickly with activity and requires increased time and rest breaks   Vision- History  Patient Visual Report No change from baseline  Bed Mobility  General bed mobility comments Pt up in chair  Transfers  Overall transfer level Needs assistance  Equipment used Rolling walker (2 wheeled);None  Transfers Sit to/from Stand  Sit to [employer] assist  General transfer comment assist to boost   Balance  Overall balance assessment Needs assistance  Sitting-balance support No upper extremity supported;Feet supported  Sitting balance-Leahy Scale Good  Standing balance support Single extremity supported;Bilateral upper extremity supported  Standing balance-Leahy Scale Poor  Standing balance comment UE support and min A   General Comments  General comments (skin integrity, edema, etc.) sats dropping to mid 80s with minimal activity on 20L HFNC.  He was instructed on pursed lip breathing.    Exercises  Exercises Other exercises  Other Exercises  Other Exercises Pt performed 10 reps shoulder flexion and abduction bil with focus on conrolled breathing in  conjuction with exercise.  OT - End of Session   Equipment Utilized During Treatment Oxygen  Activity Tolerance Patient limited by fatigue  Patient left in chair;with call bell/phone within reach;with chair alarm set  OT Assessment  OT Recommendation/Assessment Patient needs continued OT Services  OT Visit Diagnosis Unsteadiness on feet (R26.81);Muscle weakness (generalized) (M62.81)  OT Problem List Decreased strength;Decreased activity tolerance;Impaired balance (sitting and/or standing);Decreased knowledge of use of DME or AE;Cardiopulmonary status limiting activity  Barriers to Discharge Decreased caregiver support  OT Plan  OT Frequency (ACUTE ONLY) Min 2X/week  OT Treatment/Interventions (ACUTE ONLY) Self-care/ADL training;Therapeutic exercise;Energy conservation;DME and/or AE instruction;Therapeutic activities;Patient/family education;Balance training  AM-PAC OT "6 Clicks" Daily Activity Outcome Measure (Version 2)  Help from another person eating meals? 4  Help from another person taking care of personal grooming? 3  Help from another person toileting, which includes using toliet, bedpan, or urinal? 3  Help from another person bathing (including washing, rinsing, drying)? 3  Help from another person to put on and taking off regular upper body clothing? 3  Help from another person to put on and taking off regular lower body clothing? 3  6 Click Score 19  OT Recommendation  Follow Up Recommendations SNF  OT Equipment None recommended by OT  Individuals Consulted  Consulted and Agree with Results and Recommendations Patient  Acute Rehab OT Goals  Patient Stated Goal to feel better   OT Goal Formulation With patient  Time For Goal Achievement 09/27/19  Potential to Achieve Goals Good  OT Time Calculation  OT Start Time (ACUTE ONLY) 1321  OT Stop Time (ACUTE ONLY) 1334  OT Time Calculation (min) 13 min  OT General Charges  $OT Visit 1 Visit  OT Evaluation  $OT Eval Moderate Complexity 1 Mod  Written Expression  Dominant  Hand Right  Nilsa Nutting., OTR/L Acute Rehabilitation Services Pager [phone] Office [phone]

## 2019-09-13 NOTE — Significant Event (Signed)
Rapid Response Event Note  Overview:On unit seeing other pt when RN mentioned pt's SpO2-70s on 20L 80% HHFNC.   Initial Focused Assessment: Pt laying in bed with mild WOB. Pt denies SOB and  chest pain. Lungs diminished t/o. Skin cool to touch. HR-81, BP-142/73, RR-23, SpO2-81% on 20L 80% HHFNC. HHFNC titrated to 100% and 25L with SpO2 increasing to 87%. NRB then applied over HHFNC with SpO2 increasing to 95%. HHFNC then titrated back down to 20L 80%, SpO2-94%. Pt is mouth breathing so NRB is needed at this moment.   Interventions: NRB + 20L 80% HHFNC  Plan of Care (if not transferred): Leave on NRB + 20L 80% HHFNC. May wean if SpO2 and WOB allow. Continue to monitor pt. Call RRT if further assistance needed.  Event Summary:  Called/Arrived: 0630(while seeing another pt on unit) RN to alert day shift MD of happenings and FiO2 requirements  Alwyn Ren, [place]

## 2019-09-13 NOTE — Progress Notes (Signed)
   09/13/19 0630  Vitals  Pulse Rate 84  ECG Heart Rate 83  Resp (!) 26  Oxygen Therapy  SpO2 (!) 83 %  O2 Device HFNC  O2 Flow Rate (L/min) 25 L/min  FiO2 (%) 100 %  Patient Activity (if Appropriate) In bed  Pulse Oximetry Type Continuous  MEWS Score  MEWS Temp 0  MEWS Systolic 0  MEWS Pulse 0  MEWS RR 2  MEWS LOC 0  MEWS Score 2  MEWS Score Color Yellow   O2 destat noted upon waking.  Rapid response RN notified and present at bedside.  O2 monitoring site changed from earlobe to forehead.  Patient titrated up to O2 25L at 100% and non-rebreather applied.  O2 stats increased to 94% on 20L/min, 100% and non-rebreather.

## 2019-09-13 NOTE — Progress Notes (Signed)
ANTICOAGULATION CONSULT NOTE - Initial Consult  Pharmacy Consult for Eliquis Indication: atrial fibrillation and elevated d-dimer in setting of COVID infection, r/o PE  No Known Allergies  Patient Measurements: Height: 6' (182.9 cm) Weight: 115.2 kg (253 lb 15.5 oz) IBW/kg (Calculated) : 77.6 Lovenox Dosing Weight: 114.5 kg  Vital Signs: Temp: 97.3 F (36.3 C) (04/04 0753) Temp Source: Oral (04/04 0753) BP: 134/73 (04/04 0835) Pulse Rate: 84 (04/04 0835)  Labs: Recent Labs    09/14/2019 1146 10/07/2019 1146 09/22/2019 1330 09/11/19 0554 09/11/19 0554 09/12/19 0351 09/13/19 0627  HGB 15.4   < >  --  15.3   < > 15.1 15.3  HCT 47.1   < >  --  45.1  --  45.6 46.9  PLT 157   < >  --  157  --  199 220  CREATININE 0.87   < >  --  0.84  --  1.07 0.97  TROPONINIHS 64*  --  99*  --   --   --   --    < > = values in this interval not displayed.    Estimated Creatinine Clearance: 90.2 mL/min (by C-G formula based on SCr of 0.97 mg/dL).  Assessment: 74 yr old male presented 09/28/2019 with shortness of breath. Prescribed Eliquis 5 mg BID PTA for atrial fibrillation (but noted only taking once daily due to cost).  Last dose 3/31 at 10pm.  D-dimer 15.37 on admit. Transitioned to full-dose Lovenox on 4/1. CTA negative for PE. Pharmacy has now been consulted to restart Eliquis.  D-dimer down 13.94>3.86 today. Renal function stable. Hemoglobin and platelet count WNL. No bleeding noted.  Goal of Therapy:  Anti-Xa level 0.6-1 units/ml 4hrs after LMWH dose given Monitor platelets by anticoagulation protocol: Yes   Plan:  - Schedule Eliquis 5 mg PO BID to start 4/5 AM - Input end date for Lovenox - Monitor renal function, CBC, s/sx of bleeding   Ellison Carwin, PharmD PGY1 Pharmacy Resident

## 2019-09-14 ENCOUNTER — Inpatient Hospital Stay (HOSPITAL_COMMUNITY): Payer: Medicare Other

## 2019-09-14 LAB — CBC WITH DIFFERENTIAL/PLATELET
Abs Immature Granulocytes: 0.08 10*3/uL — ABNORMAL HIGH (ref 0.00–0.07)
Basophils Absolute: 0 10*3/uL (ref 0.0–0.1)
Basophils Relative: 0 %
Eosinophils Absolute: 0 10*3/uL (ref 0.0–0.5)
Eosinophils Relative: 0 %
HCT: 44.6 % (ref 39.0–52.0)
Hemoglobin: 14.8 g/dL (ref 13.0–17.0)
Immature Granulocytes: 1 %
Lymphocytes Relative: 4 %
Lymphs Abs: 0.4 10*3/uL — ABNORMAL LOW (ref 0.7–4.0)
MCH: 29.2 pg (ref 26.0–34.0)
MCHC: 33.2 g/dL (ref 30.0–36.0)
MCV: 88.1 fL (ref 80.0–100.0)
Monocytes Absolute: 0.4 10*3/uL (ref 0.1–1.0)
Monocytes Relative: 3 %
Neutro Abs: 10.4 10*3/uL — ABNORMAL HIGH (ref 1.7–7.7)
Neutrophils Relative %: 92 %
Platelets: 211 10*3/uL (ref 150–400)
RBC: 5.06 MIL/uL (ref 4.22–5.81)
RDW: 13.3 % (ref 11.5–15.5)
WBC: 11.2 10*3/uL — ABNORMAL HIGH (ref 4.0–10.5)
nRBC: 0 % (ref 0.0–0.2)

## 2019-09-14 LAB — COMPREHENSIVE METABOLIC PANEL
ALT: 15 U/L (ref 0–44)
AST: 15 U/L (ref 15–41)
Albumin: 2.3 g/dL — ABNORMAL LOW (ref 3.5–5.0)
Alkaline Phosphatase: 105 U/L (ref 38–126)
Anion gap: 10 (ref 5–15)
BUN: 45 mg/dL — ABNORMAL HIGH (ref 8–23)
CO2: 26 mmol/L (ref 22–32)
Calcium: 8.4 mg/dL — ABNORMAL LOW (ref 8.9–10.3)
Chloride: 101 mmol/L (ref 98–111)
Creatinine, Ser: 0.98 mg/dL (ref 0.61–1.24)
GFR calc Af Amer: >60 mL/min (ref >60)
GFR calc non Af Amer: >60 mL/min (ref >60)
Glucose, Bld: 329 mg/dL — ABNORMAL HIGH (ref 70–99)
Potassium: 4.2 mmol/L (ref 3.5–5.1)
Sodium: 137 mmol/L (ref 135–145)
Total Bilirubin: 0.8 mg/dL (ref 0.3–1.2)
Total Protein: 6.1 g/dL — ABNORMAL LOW (ref 6.5–8.1)

## 2019-09-14 LAB — BRAIN NATRIURETIC PEPTIDE: B Natriuretic Peptide: 167.7 pg/mL — ABNORMAL HIGH (ref 0.0–100.0)

## 2019-09-14 LAB — C-REACTIVE PROTEIN: CRP: 3.2 mg/dL — ABNORMAL HIGH (ref <1.0)

## 2019-09-14 LAB — GLUCOSE, CAPILLARY
Glucose-Capillary: 297 mg/dL — ABNORMAL HIGH (ref 70–99)
Glucose-Capillary: 437 mg/dL — ABNORMAL HIGH (ref 70–99)
Glucose-Capillary: 364 mg/dL — ABNORMAL HIGH (ref 70–99)
Glucose-Capillary: 213 mg/dL — ABNORMAL HIGH (ref 70–99)

## 2019-09-14 LAB — MAGNESIUM: Magnesium: 2.3 mg/dL (ref 1.7–2.4)

## 2019-09-14 LAB — PROCALCITONIN: Procalcitonin: 0.17 ng/mL

## 2019-09-14 LAB — D-DIMER, QUANTITATIVE: D-Dimer, Quant: 3.33 ug/mL-FEU — ABNORMAL HIGH (ref 0.00–0.50)

## 2019-09-14 MED ORDER — FUROSEMIDE 10 MG/ML IJ SOLN
40.0000 mg | Freq: Once | INTRAMUSCULAR | Status: AC
  Administered 2019-09-14: 40 mg via INTRAVENOUS
  Filled 2019-09-14: qty 4

## 2019-09-14 MED ORDER — METHYLPREDNISOLONE SODIUM SUCC 40 MG IJ SOLR
40.0000 mg | Freq: Every day | INTRAMUSCULAR | Status: DC
  Administered 2019-09-14 – 2019-09-17 (×4): 40 mg via INTRAVENOUS
  Filled 2019-09-14 (×5): qty 1

## 2019-09-14 MED ORDER — HALOPERIDOL LACTATE 5 MG/ML IJ SOLN
3.0000 mg | Freq: Once | INTRAMUSCULAR | Status: AC
  Administered 2019-09-14: 3 mg via INTRAMUSCULAR
  Filled 2019-09-14: qty 1

## 2019-09-14 MED ORDER — INSULIN ASPART 100 UNIT/ML ~~LOC~~ SOLN
35.0000 [IU] | Freq: Once | SUBCUTANEOUS | Status: AC
  Administered 2019-09-14: 35 [IU] via SUBCUTANEOUS

## 2019-09-14 MED ORDER — ZOLPIDEM TARTRATE 5 MG PO TABS
5.0000 mg | ORAL_TABLET | Freq: Every evening | ORAL | Status: DC | PRN
  Administered 2019-09-14: 5 mg via ORAL
  Filled 2019-09-14: qty 1

## 2019-09-14 MED ORDER — METHYLPREDNISOLONE SODIUM SUCC 40 MG IJ SOLR: 40.0000 mg | Freq: Two times a day (BID) | INTRAMUSCULAR | Status: DC

## 2019-09-14 MED ORDER — INSULIN GLARGINE 100 UNIT/ML ~~LOC~~ SOLN
32.0000 [IU] | Freq: Every day | SUBCUTANEOUS | Status: DC
  Administered 2019-09-15 – 2019-09-16 (×2): 32 [IU] via SUBCUTANEOUS
  Filled 2019-09-14 (×3): qty 0.32

## 2019-09-14 MED ORDER — INSULIN GLARGINE 100 UNIT/ML ~~LOC~~ SOLN
25.0000 [IU] | Freq: Every day | SUBCUTANEOUS | Status: DC
  Administered 2019-09-14: 25 [IU] via SUBCUTANEOUS
  Filled 2019-09-14: qty 0.25

## 2019-09-14 NOTE — Plan of Care (Signed)

## 2019-09-14 NOTE — Progress Notes (Signed)
PROGRESS NOTE                                                                                                                                                                                                             Patient Demographics:    Curtis Hardy, is a 74 y.o. male, DOB - 1945-06-19, NOB:[id number]  Outpatient Primary MD for the patient is Patient, No Pcp Per    LOS - 4  Admit date - 10/08/2019    Chief Complaint  Patient presents with  . Shortness of Breath       Brief Narrative  Curtis Hardy is a 74 y.o. male with medical history significant of new onset A. Fib-on Eliquis, diabetes mellitus presents to emergency department due to worsening of shortness of breath.  Patient recently admitted on 03/19 at [facility] due to COVID-19, new onset A. fib and fluid overload.  He was diuresed and received dexamethasone and remdesivir while he was hospitalized.  His CHA2DS2-VASc score was 3.  Patient discharged home on Eliquis, dexamethasone.  He finished dexamethasone on 09/07/19.  ER at [facility] he was found to have severe hypoxic respiratory failure requiring 15 L nasal cannula oxygen and was admitted to the hospital.   Subjective:   Patient in bed, appears comfortable, denies any headache, no fever, no chest pain or pressure, improving shortness of breath , no abdominal pain. No focal weakness.    Assessment  & Plan :     1. Acute Hypoxic Resp. Failure due to Acute Covid 19 Viral Pneumonitis during the ongoing 2020 Covid 19 Pandemic - he has severe parenchymal injury, his symptoms have been present for at least 7 to 10 days if not more.  He has been started on IV steroids high-dose, he has already finished remdesivir during his previous hospitalization at [facility] recently, received Actemra after consent early on 09/11/2019.    Unfortunately he has developed severe parenchymal lung injury,  was requiring 15 L oxygen plus nonrebreather mask and was transitioned to 20 L heated high flow on 09/12/2019 at 100% FiO2, on 09/13/2019 he is still on 25 L heated high flow but has been brought down to 100% FiO2.  Still extremely tenuous and will be closely monitored.    Encouraged the patient to sit up in chair in the  daytime use I-S and flutter valve for pulmonary toiletry and then prone in bed when at night.  Will advance activity and titrate down oxygen as possible.  SpO2: 92 % O2 Flow Rate (L/min): 25 L/min FiO2 (%): 100 %  Recent Labs  Lab 10/04/2019 1146 09/11/19 0554 09/12/19 0351 09/13/19 0627 09/14/19 0307  WBC 13.8* 11.5* 13.3* 12.4* 11.2*  HGB 15.4 15.3 15.1 15.3 14.8  HCT 47.1 45.1 45.6 46.9 44.6  PLT 157 157 199 220 211  MCV 88.9 86.4 86.9 88.0 88.1  MCH 29.1 29.3 28.8 28.7 29.2  MCHC 32.7 33.9 33.1 32.6 33.2  RDW 13.5 13.5 13.7 13.6 13.3  LYMPHSABS  --  0.4* 0.5* 0.4* 0.4*  MONOABS  --  0.3 0.4 0.3 0.4  EOSABS  --  0.0 0.0 0.0 0.0  BASOSABS  --  0.0 0.0 0.0 0.0    Recent Labs  Lab 09/11/2019 1146 09/28/2019 1250 10/06/2019 1632 09/11/19 0554 09/12/19 0351 09/13/19 0627 09/14/19 0307  NA 138  --   --  135 136 137 137  K 3.7  --   --  3.5 4.0 3.9 4.2  CL 100  --   --  97* 99 99 101  CO2 20*  --   --  GLUCOSE 284*  --   --  302* 335* 290* 329*  BUN 14  --   --  17 37* 51* 45*  CREATININE 0.87  --   --  0.84 1.07 0.97 0.98  CALCIUM 8.4*  --   --  8.3* 8.4* 8.4* 8.4*  AST 27  --   --  28 17 14* 15  ALT 15  --   --  ALKPHOS 103  --   --  103 112 109 105  BILITOT 2.0*  --   --  1.3* 1.0 1.1 0.8  ALBUMIN 2.6*  --   --  2.3* 2.3* 2.4* 2.3*  MG  --   --   --  1.7 2.2 2.4 2.3  CRP  --  23.9*  --  27.0* 16.1* 6.6* 3.2*  DDIMER  --  15.37*  --  >20.00* 13.94* 3.86* 3.33*  PROCALCITON  --  0.24  --  0.71 0.83 0.26 0.17  HGBA1C  --   --  8.5*  --   --   --   --   BNP 148.5*  --   --  181.2* 118.2* 109.8* 167.7*    Recent Labs  Lab  09/21/2019 1146 10/03/2019 1250 09/11/19 0554 09/12/19 0351 09/13/19 0627 09/14/19 0307  CRP  --  23.9* 27.0* 16.1* 6.6* 3.2*  DDIMER  --  15.37* >20.00* 13.94* 3.86* 3.33*  BNP 148.5*  --  181.2* 118.2* 109.8* 167.7*  PROCALCITON  --  0.24 0.71 0.83 0.26 0.17        2.  Elevated D-dimer.  CTA negative for PE and lower extremity venous duplex negative for DVT however he is at very high risk of developing a blood clot due to intense inflammation, continue full dose Lovenox till D-dimer is persistently dropping and inflammation is well controlled (he needs anticoagulation for A. fib as well).  3.  Recent paroxysmal A. fib.  Placed on low-dose oral Cardizem, [place] vas 2 score is greater than 2 and was started on Eliquis at [facility], for now full dose Lovenox and monitor.  4.  Acute on chronic diastolic CHF.  EF 55% on recent echocardi[facility] at [facility].  Likely due to RVR.  Gentle IV Lasix, repeat on 09/14/2019 and monitor.  5.  Reduced oral intake.  Poor appetite, placed on Glucerna shakes and Prozac.  Encouraged to eat.  6. Urinary retention - Foley + Flomax.  7. DM2 - on Lantus and sliding scale, steroids being rapidly tapered.  Adjusted insulin dose on 09/14/2019 for better control.  Poor outpatient control due to hyperglycemia.  Lab Results  Component Value Date   HGBA1C 8.5 (H) 09-26-19   CBG (last 3)  Recent Labs    09/13/19 2106 09/14/19 0755 09/14/19 1154  GLUCAP 383* 297* 437*      Condition - Extremely Guarded  Family Communication  :  Curtis Hardy - [phone] -  09/11/19, 09/12/19, 09/13/19, 09/14/19 - clearly explained prognosis is extremely GUARDED.  Code Status :  Full  Diet :   Diet Order            DIET SOFT Room service appropriate? Yes; Fluid consistency: Thin  Diet effective now               Disposition Plan  :  Stay in the Hospital for severe Hypoxic Resp failure   Consults  :  None  Procedures  :    Outside TTE 03/21 -   Left  ventricular systolic function is normal.  LV ejection fraction = 55-60%.  No segmental wall motion abnormalities seen in the left ventricle  Left ventricular filling pattern is indeterminate.  The right ventricular systolic function is normal.  There is mild aortic stenosis.  IVC size was mildly dilated.  There is no comparison study available.     CTA - No PE  Leg [place] - No DVT  PUD Prophylaxis :    DVT Prophylaxis  :  Lovenox   Lab Results  Component Value Date   PLT 211 09/14/2019    Inpatient Medications  Scheduled Meds: . apixaban  5 mg Oral BID  . vitamin C  500 mg Oral Daily  . Chlorhexidine Gluconate Cloth  6 each Topical Daily  . feeding supplement (GLUCERNA SHAKE)  237 mL Oral TID BM  . feeding supplement (PRO-STAT SUGAR FREE 64)  30 mL Oral TID WC  . insulin aspart  0-15 Units Subcutaneous TID WC  . insulin aspart  0-5 Units Subcutaneous QHS  . [START ON 09/15/2019] insulin glargine  32 Units Subcutaneous Daily  . [START ON 09/15/2019] methylPREDNISolone (SOLU-MEDROL) injection  40 mg Intravenous Daily  . metoprolol tartrate  50 mg Oral BID  . tamsulosin  0.4 mg Oral Daily  . zinc sulfate  220 mg Oral Daily   Continuous Infusions: . doxycycline (VIBRAMYCIN) IV 100 mg (09/14/19 0248)   PRN Meds:.acetaminophen, chlorpheniramine-HYDROcodone, guaiFENesin-dextromethorphan, hydrALAZINE, Ipratropium-Albuterol, [DISCONTINUED] ondansetron **OR** ondansetron (ZOFRAN) IV, sodium chloride  Antibiotics  :    Anti-infectives (From admission, onward)   Start     Dose/Rate Route Frequency Ordered Stop   09/11/19 1000  remdesivir 100 mg in sodium chloride 0.9 % 100 mL IVPB  Status:  Discontinued     100 mg 200 mL/hr over 30 Minutes Intravenous Daily 09-26-19 1349 2019-09-26 1359   09/11/19 1000  remdesivir 100 mg in sodium chloride 0.9 % 100 mL IVPB  Status:  Discontinued     100 mg 200 mL/hr over 30 Minutes Intravenous Daily 09/26/2019 1402 2019/09/26 1518   2019/09/26  1530  doxycycline (VIBRAMYCIN) 100 mg in sodium chloride 0.9 % 250 mL IVPB     100  mg 125 mL/hr over 120 Minutes Intravenous Every 12 hours 09/17/2019 1501     10/07/2019 1500  remdesivir 200 mg in sodium chloride 0.9% 250 mL IVPB  Status:  Discontinued     200 mg 580 mL/hr over 30 Minutes Intravenous Once 10/06/2019 1402 09/29/2019 1518   09/19/2019 1400  remdesivir 200 mg in sodium chloride 0.9% 250 mL IVPB  Status:  Discontinued     200 mg 580 mL/hr over 30 Minutes Intravenous Once 09/24/2019 1349 09/30/2019 1359       Time Spent in minutes  30   Susa Raring M.D on 09/14/2019 at 12:10 PM  To page go to [email or web address] - password [email or web address]  [facility] -  Office  [phone]    See all Orders from today for further details    Objective:   Vitals:   09/14/19 0500 09/14/19 0757 09/14/19 0851 09/14/19 1153  BP:  (!) 157/93 (!) 153/90 123/75  Pulse:   94 87  Resp:   (!) 21 (!) 21  Temp: 98.7 F (37.1 C) (!) 97.1 F (36.2 C)  98 F (36.7 C)  TempSrc: Axillary Axillary  Axillary  SpO2:  90% 91% 92%  Weight:      Height:        Wt Readings from Last 3 Encounters:  09/12/19 115.2 kg     Intake/Output Summary (Last 24 hours) at 09/14/2019 1210 Last data filed at 09/14/2019 1056 Gross per 24 hour  Intake 240 ml  Output 1300 ml  Net -1060 ml     Physical Exam  Awake Alert, No new F.N deficits, Normal affect [place].AT,PERRAL Supple Neck,No JVD, No cervical lymphadenopathy appriciated.  Symmetrical Chest wall movement, Good air movement bilaterally, CTAB RRR,No Gallops, Rubs or new Murmurs, No Parasternal Heave +ve B.Sounds, Abd Soft, No tenderness, No organomegaly appriciated, No rebound - guarding or rigidity. No Cyanosis, Clubbing or edema, No new Rash or bruise     Data Review:    CBC Recent Labs  Lab 09/24/2019 1146 09/11/19 0554 09/12/19 0351 09/13/19 0627 09/14/19 0307  WBC 13.8* 11.5* 13.3* 12.4* 11.2*  HGB 15.4 15.3 15.1 15.3 14.8  HCT 47.1 45.1 45.6 46.9 44.6   PLT 157 157 199 220 211  MCV 88.9 86.4 86.9 88.0 88.1  MCH 29.1 29.3 28.8 28.7 29.2  MCHC 32.7 33.9 33.1 32.6 33.2  RDW 13.5 13.5 13.7 13.6 13.3  LYMPHSABS  --  0.4* 0.5* 0.4* 0.4*  MONOABS  --  0.3 0.4 0.3 0.4  EOSABS  --  0.0 0.0 0.0 0.0  BASOSABS  --  0.0 0.0 0.0 0.0    Chemistries  Recent Labs  Lab 10/08/2019 1146 09/11/2019 1632 09/11/19 0554 09/12/19 0351 09/13/19 0627 09/14/19 0307  NA 138  --  135 136 137 137  K 3.7  --  3.5 4.0 3.9 4.2  CL 100  --  97* 99 99 101  CO2 20*  --  22 23 25 26   GLUCOSE 284*  --  302* 335* 290* 329*  BUN 14  --  17 37* 51* 45*  CREATININE 0.87  --  0.84 1.07 0.97 0.98  CALCIUM 8.4*  --  8.3* 8.4* 8.4* 8.4*  AST 27  --  28 17 14* 15  ALT 15  --  16 16 17 15   ALKPHOS 103  --  103 112 109 105  BILITOT 2.0*  --  1.3* 1.0 1.1 0.8  MG  --   --  1.7 2.2 2.4 2.3  HGBA1C  --  8.5*  --   --   --   --      ------------------------------------------------------------------------------------------------------------------ No results for input(s): CHOL, HDL, LDLCALC, TRIG, CHOLHDL, LDLDIRECT in the last 72 hours.  Lab Results  Component Value Date   HGBA1C 8.5 (H) 09/21/2019   ------------------------------------------------------------------------------------------------------------------ No results for input(s): TSH, T4TOTAL, T3FREE, THYROIDAB in the last 72 hours.  Invalid input(s): FREET3  Cardiac Enzymes No results for input(s): CKMB, TROPONINI, MYOGLOBIN in the last 168 hours.  Invalid input(s): CK ------------------------------------------------------------------------------------------------------------------    Component Value Date/Time   BNP 167.7 (H) 09/14/2019 0307    Micro Results Recent Results (from the past 240 hour(s))  MRSA PCR Screening     Status: None   Collection Time: 09/11/19  4:30 AM   Specimen: Nasal Mucosa; Nasopharyngeal  Result Value Ref Range Status   MRSA by PCR NEGATIVE NEGATIVE Final    Comment:         The GeneXpert MRSA Assay (FDA approved for NASAL specimens only), is one component of a comprehensive MRSA colonization surveillance program. It is not intended to diagnose MRSA infection nor to guide or monitor treatment for MRSA infections. Performed at [facility], [address] [place], [place] [address]     Radiology Reports CT ANGIO CHEST PE W OR WO CONTRAST  Result Date: 09/11/2019 CLINICAL DATA:  Shortness of breath.  COVID-19 positive EXAM: CT ANGIOGRAPHY CHEST WITH CONTRAST TECHNIQUE: Multidetector CT imaging of the chest was performed using the standard protocol during bolus administration of intravenous contrast. Multiplanar CT image reconstructions and MIPs were obtained to evaluate the vascular anatomy. CONTRAST:  65mL OMNIPAQUE IOHEXOL 350 MG/ML SOLN COMPARISON:  Chest radiograph September 10, 2019 FINDINGS: Cardiovascular: There is no demonstrable pulmonary embolus. There is no thoracic aortic aneurysm or dissection. There are scattered foci of great vessel calcification. There is aortic atherosclerosis. There are multiple foci of coronary artery calcification. There is no pericardial effusion or pericardial thickening. Mediastinum/Nodes: There is a 6 mm nodular opacity in the left lobe of the thyroid which per consensus guidelines does not warrant additional imaging surveillance. Thyroid otherwise appears unremarkable. There is a 1.4 x 1.1 cm subcarinal lymph node. Elsewhere there are scattered subcentimeter mediastinal lymph nodes which do not meet size criteria for pathologic significance. No esophageal lesions are appreciable. Lungs/Pleura: There is widespread airspace opacity throughout the lungs diffusely with involvement of most lobes in segments. There is mild fibrotic change in the apices. No appreciable pleural effusions. Upper Abdomen: In the visualized upper abdomen, there is aortic atherosclerosis. Visualized upper abdominal structures otherwise appear unremarkable.  Musculoskeletal: There is degenerative change in the mid and lower thoracic spine with diffuse idiopathic skeletal hyperostosis. There old healed rib fractures on the left with remodeling. No blastic or lytic bone lesions. No evident chest wall lesions. Review of the MIP images confirms the above findings. IMPRESSION: 1. No demonstrable pulmonary embolus. No thoracic aortic aneurysm or dissection. There is aortic atherosclerosis as well as foci of great vessel and coronary artery calcification. 2. Widespread airspace opacity throughout the lungs diffusely consistent with widespread multifocal pneumonia. Suspect atypical organism pneumonia, particularly given history. 3. Mildly prominent subcarinal lymph node which may well be of reactive etiology given the extensive lung parenchymal abnormality. No other adenopathy by size criteria. 4.  Diffuse idiopathic skeletal hyperostosis. Aortic Atherosclerosis (ICD10-I70.0). Electronically Signed   By: Bretta Bang III M.D.   On: 09/11/2019 10:00   DG Chest [facility] 1 View  Result  Date: 09/14/2019 CLINICAL DATA:  Shortness of breath with decreased oxygen saturation EXAM: PORTABLE CHEST 1 VIEW COMPARISON:  Chest radiograph 10-04-2019; chest CT angiogram September 11, 2019 FINDINGS: There has been slight interval clearing of airspace opacity from the left lower lobe. Ill-defined airspace opacity throughout much of the right lung is essentially stable. No new opacity evident heart is upper normal in size with pulmonary vascularity normal. No adenopathy. No bone lesions. IMPRESSION: Extensive airspace opacity bilaterally, more on the right than on the left. Slight clearing on the left since recent studies. Stable changes on the right. Stable cardiac silhouette. Electronically Signed   By: Bretta Bang III M.D.   On: 09/14/2019 08:15   DG Chest Port 1 View  Result Date: 10-04-2019 CLINICAL DATA:  Shortness breath with COVID-19 positive status EXAM: PORTABLE CHEST 1 VIEW  COMPARISON:  None. FINDINGS: Extensive airspace opacity is noted throughout the right lung as well as in the left lower lung region. Consolidation is noted in portions of the right lower lobe. Heart size and pulmonary vascularity within normal limits. No adenopathy. There are multiple old healed rib fractures on the left. There is degenerative change in each shoulder. IMPRESSION: Multifocal airspace opacity, somewhat more pronounced on the right than on the left with consolidation in the right base. Appearance consistent with multifocal pneumonia. There may be a degree of underlying parenchymal fibrotic type change. Heart size within normal limits. No adenopathy evident. Multiple old healed rib fractures on the left. Electronically Signed   By: Bretta Bang III M.D.   On: October 04, 2019 13:00   VAS [place] LOWER EXTREMITY VENOUS (DVT)  Result Date: 09/13/2019  Lower Venous DVTStudy Indications: Swelling.  Comparison Study: No prior exam. Performing Technologist: Kennedy Bucker ARDMS, RVT  Examination Guidelines: A complete evaluation includes B-mode imaging, spectral Doppler, color Doppler, and power Doppler as needed of all accessible portions of each vessel. Bilateral testing is considered an integral part of a complete examination. Limited examinations for reoccurring indications may be performed as noted. The reflux portion of the exam is performed with the patient in reverse Trendelenburg.  +---------+---------------+---------+-----------+----------+--------------+ RIGHT    CompressibilityPhasicitySpontaneityPropertiesThrombus Aging +---------+---------------+---------+-----------+----------+--------------+ CFV      Full           Yes      Yes                                 +---------+---------------+---------+-----------+----------+--------------+ SFJ      Full                                                         +---------+---------------+---------+-----------+----------+--------------+ FV Prox  Full                                                        +---------+---------------+---------+-----------+----------+--------------+ FV Mid   Full                                                        +---------+---------------+---------+-----------+----------+--------------+  FV DistalFull                                                        +---------+---------------+---------+-----------+----------+--------------+ PFV      Full                                                        +---------+---------------+---------+-----------+----------+--------------+ POP      Full           Yes      Yes                                 +---------+---------------+---------+-----------+----------+--------------+ PTV      Full                                                        +---------+---------------+---------+-----------+----------+--------------+ PERO     Full                                                        +---------+---------------+---------+-----------+----------+--------------+   +---------+---------------+---------+-----------+----------+--------------+ LEFT     CompressibilityPhasicitySpontaneityPropertiesThrombus Aging +---------+---------------+---------+-----------+----------+--------------+ CFV      Full           Yes      Yes                                 +---------+---------------+---------+-----------+----------+--------------+ SFJ      Full                                                        +---------+---------------+---------+-----------+----------+--------------+ FV Prox  Full                                                        +---------+---------------+---------+-----------+----------+--------------+ FV Mid   Full                                                         +---------+---------------+---------+-----------+----------+--------------+ FV DistalFull                                                        +---------+---------------+---------+-----------+----------+--------------+  PFV      Full                                                        +---------+---------------+---------+-----------+----------+--------------+ POP      Full           Yes      Yes                                 +---------+---------------+---------+-----------+----------+--------------+ PTV      Full                                                        +---------+---------------+---------+-----------+----------+--------------+ PERO     Full                                                        +---------+---------------+---------+-----------+----------+--------------+     Summary: BILATERAL: - No evidence of deep vein thrombosis seen in the lower extremities, bilaterally.  RIGHT: - Hypoechoic, complex structure with no blood flow visualized in popliteal fossa measuring 3.7 x 1.5 x 3.3 cm.  LEFT: - No cystic structure found in the popliteal fossa.  *See table(s) above for measurements and observations. Electronically signed by Gretta Beganodd Early MD on 09/13/2019 at 8:18:24 AM.    Final

## 2019-09-14 NOTE — TOC Initial Note (Signed)
Transition of Care [facility]) - Initial/Assessment Note    Patient Details  Name: Curtis Hardy MRN: [id number] Date of Birth: 1946-05-01  Transition of Care [facility] CM/SW Contact:    Curtis Latin, LCSW Phone Number: 09/14/2019, 2:50 PM  Clinical Narrative:                 CSW spoke with patient regarding discharge planning, however, patient reported that he was feeling very sick and requested CSW contact him back later.   Expected Discharge Plan: Skilled Nursing Facility Barriers to Discharge: Continued Medical Work up   Patient Goals and CMS Choice Patient states their goals for this hospitalization and ongoing recovery are:: Feel better CMS Medicare.gov Compare Post Acute Care list provided to:: Patient Choice offered to / list presented to : Patient  Expected Discharge Plan and Services Expected Discharge Plan: Skilled Nursing Facility In-house Referral: Clinical Social Work   Post Acute Care Choice: Skilled Nursing Facility Living arrangements for the past 2 months: Single Family Home                                      Prior Living Arrangements/Services Living arrangements for the past 2 months: Single Family Home Lives with:: Self Patient language and need for interpreter reviewed:: Yes Do you feel safe going back to the place where you live?: Yes      Need for Family Participation in Patient Care: Yes (Comment) Care giver support system in place?: Yes (comment)   Criminal Activity/Legal Involvement Pertinent to Current Situation/Hospitalization: No - Comment as needed  Activities of Daily Living Home Assistive Devices/Equipment: None ADL Screening (condition at time of admission) Patient's cognitive ability adequate to safely complete daily activities?: Yes Is the patient deaf or have difficulty hearing?: No Does the patient have difficulty seeing, even when wearing glasses/contacts?: No Does the patient have difficulty concentrating, remembering, or making  decisions?: No Patient able to express need for assistance with ADLs?: No Does the patient have difficulty dressing or bathing?: No Independently performs ADLs?: Yes (appropriate for developmental age) Does the patient have difficulty walking or climbing stairs?: No Weakness of Legs: None Weakness of Arms/Hands: None  Permission Sought/Granted Permission sought to share information with : Facility Medical sales representative, Family Supports                Emotional Assessment   Attitude/Demeanor/Rapport: (Not feeling well) Affect (typically observed): Accepting, Appropriate Orientation: : Oriented to Self, Oriented to Place, Oriented to  Time, Oriented to Situation Alcohol / Substance Use: Not Applicable Psych Involvement: No (comment)  Admission diagnosis:  Acute respiratory failure with hypoxia (HCC) [J96.01] Acute hypoxemic respiratory failure due to COVID-19 (HCC) [U07.1, J96.01] COVID-19 [U07.1] Patient Active Problem List   Diagnosis Date Noted  . Acute hypoxemic respiratory failure due to COVID-19 (HCC) Sep 18, 2019  . Diabetes mellitus without complication (HCC)   . CHF (congestive heart failure) (HCC)   . A-fib (HCC)   . HTN (hypertension)   . CARPAL TUNNEL SYNDROME, BILATERAL 04/01/2007   PCP:  Patient, No Pcp Per Pharmacy:   [facility] - [place] - [address] [place] [address] [address] [place] [address] Phone: [phone] Fax: [phone]     Social Determinants of Health (SDOH) Interventions    Readmission Risk Interventions No flowsheet data found.

## 2019-09-14 NOTE — Plan of Care (Signed)
  Problem: Respiratory: Goal: Will maintain a patent airway Outcome: Progressing Goal: Complications related to the disease process, condition or treatment will be avoided or minimized Outcome: Progressing   

## 2019-09-14 NOTE — Progress Notes (Signed)
Inpatient Diabetes Program Recommendations  AACE/ADA: New Consensus Statement on Inpatient Glycemic Control (2015)  Target Ranges:  Prepandial:   less than 140 mg/dL      Peak postprandial:   less than 180 mg/dL (1-2 hours)      Critically ill patients:  140 - 180 mg/dL   Lab Results  Component Value Date   GLUCAP 437 (H) 09/14/2019   HGBA1C 8.5 (H) 10/03/2019    Review of Glycemic Control Results for Curtis Hardy, Curtis Hardy (MRN [id number]) as of 09/14/2019 13:17  Ref. Range 09/13/2019 11:58 09/13/2019 16:53 09/13/2019 21:06 09/14/2019 07:55 09/14/2019 11:54  Glucose-Capillary Latest Ref Range: 70 - 99 mg/dL 473 (H) 403 (H) 709 (H) 297 (H) 437 (H)   Diabetes history: DM 2 Outpatient Diabetes medications:  Glipizide XL 5 mg daily, Humulin 70/30 10 units daily Current orders for Inpatient glycemic control:  Novolog moderate tid with meals and HS Lantus 32 units daily Solumedrol 40 mg IV daily Glucerna 237 ml tid with meals  Inpatient Diabetes Program Recommendations:    May consider adding Novolog meal coverage 6 units tid with meals (hold if patient eats less than 50%).   Thanks,  Beryl Meager, RN, BC-ADM Inpatient Diabetes Coordinator Pager [phone] (8a-5p)

## 2019-09-15 LAB — C-REACTIVE PROTEIN: CRP: 1.5 mg/dL — ABNORMAL HIGH (ref <1.0)

## 2019-09-15 LAB — COMPREHENSIVE METABOLIC PANEL
ALT: 15 U/L (ref 0–44)
AST: 16 U/L (ref 15–41)
Albumin: 2.3 g/dL — ABNORMAL LOW (ref 3.5–5.0)
Alkaline Phosphatase: 102 U/L (ref 38–126)
Anion gap: 8 (ref 5–15)
BUN: 47 mg/dL — ABNORMAL HIGH (ref 8–23)
CO2: 25 mmol/L (ref 22–32)
Calcium: 8.2 mg/dL — ABNORMAL LOW (ref 8.9–10.3)
Chloride: 104 mmol/L (ref 98–111)
Creatinine, Ser: 0.68 mg/dL (ref 0.61–1.24)
GFR calc Af Amer: >60 mL/min (ref >60)
GFR calc non Af Amer: >60 mL/min (ref >60)
Glucose, Bld: 168 mg/dL — ABNORMAL HIGH (ref 70–99)
Potassium: 3.9 mmol/L (ref 3.5–5.1)
Sodium: 137 mmol/L (ref 135–145)
Total Bilirubin: 0.8 mg/dL (ref 0.3–1.2)
Total Protein: 5.7 g/dL — ABNORMAL LOW (ref 6.5–8.1)

## 2019-09-15 LAB — CBC WITH DIFFERENTIAL/PLATELET
Abs Immature Granulocytes: 0.09 10*3/uL — ABNORMAL HIGH (ref 0.00–0.07)
Basophils Absolute: 0 10*3/uL (ref 0.0–0.1)
Basophils Relative: 0 %
Eosinophils Absolute: 0 10*3/uL (ref 0.0–0.5)
Eosinophils Relative: 0 %
HCT: 46.6 % (ref 39.0–52.0)
Hemoglobin: 15.7 g/dL (ref 13.0–17.0)
Immature Granulocytes: 1 %
Lymphocytes Relative: 3 %
Lymphs Abs: 0.4 10*3/uL — ABNORMAL LOW (ref 0.7–4.0)
MCH: 29.1 pg (ref 26.0–34.0)
MCHC: 33.7 g/dL (ref 30.0–36.0)
MCV: 86.5 fL (ref 80.0–100.0)
Monocytes Absolute: 0.5 10*3/uL (ref 0.1–1.0)
Monocytes Relative: 4 %
Neutro Abs: 10.3 10*3/uL — ABNORMAL HIGH (ref 1.7–7.7)
Neutrophils Relative %: 92 %
Platelets: 181 10*3/uL (ref 150–400)
RBC: 5.39 MIL/uL (ref 4.22–5.81)
RDW: 13.2 % (ref 11.5–15.5)
WBC: 11.3 10*3/uL — ABNORMAL HIGH (ref 4.0–10.5)
nRBC: 0 % (ref 0.0–0.2)

## 2019-09-15 LAB — D-DIMER, QUANTITATIVE: D-Dimer, Quant: 3.73 ug/mL-FEU — ABNORMAL HIGH (ref 0.00–0.50)

## 2019-09-15 LAB — BRAIN NATRIURETIC PEPTIDE: B Natriuretic Peptide: 237.3 pg/mL — ABNORMAL HIGH (ref 0.0–100.0)

## 2019-09-15 LAB — MAGNESIUM: Magnesium: 2.2 mg/dL (ref 1.7–2.4)

## 2019-09-15 LAB — GLUCOSE, CAPILLARY
Glucose-Capillary: 111 mg/dL — ABNORMAL HIGH (ref 70–99)
Glucose-Capillary: 184 mg/dL — ABNORMAL HIGH (ref 70–99)
Glucose-Capillary: 295 mg/dL — ABNORMAL HIGH (ref 70–99)
Glucose-Capillary: 363 mg/dL — ABNORMAL HIGH (ref 70–99)

## 2019-09-15 LAB — PROCALCITONIN: Procalcitonin: 0.1 ng/mL

## 2019-09-15 MED ORDER — FUROSEMIDE 10 MG/ML IJ SOLN
60.0000 mg | Freq: Once | INTRAMUSCULAR | Status: AC
  Administered 2019-09-15: 60 mg via INTRAVENOUS
  Filled 2019-09-15: qty 6

## 2019-09-15 MED ORDER — POTASSIUM CHLORIDE CRYS ER 20 MEQ PO TBCR
40.0000 meq | EXTENDED_RELEASE_TABLET | Freq: Once | ORAL | Status: AC
  Administered 2019-09-15: 40 meq via ORAL
  Filled 2019-09-15: qty 2

## 2019-09-15 MED ORDER — FUROSEMIDE 10 MG/ML IJ SOLN
40.0000 mg | Freq: Once | INTRAMUSCULAR | Status: AC
  Administered 2019-09-15: 40 mg via INTRAVENOUS
  Filled 2019-09-15: qty 4

## 2019-09-15 MED ORDER — FUROSEMIDE 10 MG/ML IJ SOLN: 40.0000 mg | Freq: Once | INTRAMUSCULAR | Status: DC

## 2019-09-15 NOTE — Progress Notes (Signed)
PROGRESS NOTE                                                                                                                                                                                                             Patient Demographics:    Curtis Hardy, is a 74 y.o. male, DOB - [birth year], YCX:[id number]  Outpatient Primary MD for the patient is Patient, No Pcp Per    LOS - 5  Admit date - 09/23/2019    Chief Complaint  Patient presents with  . Shortness of Breath       Brief Narrative  Curtis Hardy is a 74 y.o. male with medical history significant of new onset A. Fib-on Eliquis, diabetes mellitus presents to emergency department due to worsening of shortness of breath.  Patient recently admitted on 03/19 at [facility] due to COVID-19, new onset A. fib and fluid overload.  He was diuresed and received dexamethasone and remdesivir while he was hospitalized.  His CHA2DS2-VASc score was 3.  Patient discharged home on Eliquis, dexamethasone.  He finished dexamethasone on 09/07/19.  ER at [facility] he was found to have severe hypoxic respiratory failure requiring 15 L nasal cannula oxygen and was admitted to the hospital.  This hospitalization he was treated with high-dose IV steroids and Actemra however he had already incurred a lot of parenchymal injury during his previous hospitalization at [facility] and subsequent discharge course from there.  He is still on heated high flow oxygen, currently being diuresed as there is some evidence of fluid overload on exam.  Overall extremely tenuous.   Subjective:   Patient in bed, appears comfortable, denies any headache, no fever, no chest pain or pressure, +ve shortness of breath , no abdominal pain. No focal weakness.   Assessment  & Plan :     1. Acute Hypoxic Resp. Failure due to Acute Covid 19 Viral Pneumonitis during the ongoing 2020 Covid 19 Pandemic  - he has severe parenchymal injury, his symptoms have been present for at least 7 to 10 days if not more.  He has been started on IV steroids high-dose, he has already finished remdesivir during his previous hospitalization at [facility] recently, received Actemra after consent early on 09/11/2019.    Unfortunately he has developed severe parenchymal lung injury, was requiring 15  L oxygen plus nonrebreather mask and was transitioned to 25 L heated high flow on 09/12/2019 at 100% FiO2, steroids being gradually tapered, being diuresed with IV Lasix as he can tolerate, marginal clinical improvement, condition remains extremely tenuous.    Encouraged the patient to sit up in chair in the daytime use I-S and flutter valve for pulmonary toiletry and then prone in bed when at night.  Will advance activity and titrate down oxygen as possible.  SpO2: 95 % O2 Flow Rate (L/min): 25 L/min(decreased because patient o2 level is 95%.  goal is 92%.) FiO2 (%): 100 %  Recent Labs  Lab 09/11/19 0554 09/12/19 0351 09/13/19 0627 09/14/19 0307 09/15/19 0234  WBC 11.5* 13.3* 12.4* 11.2* 11.3*  HGB 15.3 15.1 15.3 14.8 15.7  HCT 45.1 45.6 46.9 44.6 46.6  PLT 157 199 220 211 181  MCV 86.4 86.9 88.0 88.1 86.5  MCH 29.3 28.8 28.7 29.2 29.1  MCHC 33.9 33.1 32.6 33.2 33.7  RDW 13.5 13.7 13.6 13.3 13.2  LYMPHSABS 0.4* 0.5* 0.4* 0.4* 0.4*  MONOABS 0.3 0.4 0.3 0.4 0.5  EOSABS 0.0 0.0 0.0 0.0 0.0  BASOSABS 0.0 0.0 0.0 0.0 0.0    Recent Labs  Lab Oct 05, 2019 1146 October 05, 2019 1632 09/11/19 0554 09/12/19 0351 09/13/19 0627 09/14/19 0307 09/15/19 0234  NA   < >  --  135 136 137 137 137  K   < >  --  3.5 4.0 3.9 4.2 3.9  CL   < >  --  97* 99 99 101 104  CO2   < >  --  22 23 25 26 25   GLUCOSE   < >  --  302* 335* 290* 329* 168*  BUN   < >  --  17 37* 51* 45* 47*  CREATININE   < >  --  0.84 1.07 0.97 0.98 0.68  CALCIUM   < >  --  8.3* 8.4* 8.4* 8.4* 8.2*  AST   < >  --  28 17 14* 15 16  ALT   < >  --  16 16 17 15 15     ALKPHOS   < >  --  103 112 109 105 102  BILITOT   < >  --  1.3* 1.0 1.1 0.8 0.8  ALBUMIN   < >  --  2.3* 2.3* 2.4* 2.3* 2.3*  MG  --   --  1.7 2.2 2.4 2.3 2.2  CRP   < >  --  27.0* 16.1* 6.6* 3.2* 1.5*  DDIMER   < >  --  >20.00* 13.94* 3.86* 3.33* 3.73*  PROCALCITON   < >  --  0.71 0.83 0.26 0.17 0.10  HGBA1C  --  8.5*  --   --   --   --   --   BNP   < >  --  181.2* 118.2* 109.8* 167.7* 237.3*   < > = values in this interval not displayed.    Recent Labs  Lab 09/11/19 0554 09/12/19 0351 09/13/19 0627 09/14/19 0307 09/15/19 0234  CRP 27.0* 16.1* 6.6* 3.2* 1.5*  DDIMER >20.00* 13.94* 3.86* 3.33* 3.73*  BNP 181.2* 118.2* 109.8* 167.7* 237.3*  PROCALCITON 0.71 0.83 0.26 0.17 0.10        2.  Elevated D-dimer.  CTA negative for PE and lower extremity venous duplex negative for DVT however he is at very high risk of developing a blood clot due to intense inflammation, continue full dose Lovenox till D-dimer is  persistently dropping and inflammation is well controlled (he needs anticoagulation for A. fib as well).  3.  Recent paroxysmal A. fib.  Placed on low-dose oral Cardizem, [place] vas 2 score is greater than 2 and was started on Eliquis at [facility], for now full dose Lovenox and monitor.  4.  Acute on chronic diastolic CHF.  EF 55% on recent echocardiogram at [facility].  Likely due to RVR.  Some improvement with IV Lasix on 09/14/2019, will repeat Lasix 60 mg this morning and 40 again this evening IV, kindly monitor and readdress Lasix dose daily.  5.  Reduced oral intake.  Poor appetite, placed on Glucerna shakes and Prozac.  Encouraged to eat.  6. Urinary retention - Foley + Flomax.  7. DM2 - on Lantus and sliding scale, steroids being rapidly tapered.  Adjusted insulin dose on 09/14/2019 for better control.  Poor outpatient control due to hyperglycemia.  Lab Results  Component Value Date   HGBA1C 8.5 (H) 10/06/2019   CBG (last 3)  Recent Labs    09/14/19 2025  09/15/19 0801 09/15/19 1148  GLUCAP 213* 111* 184*      Condition - Extremely Guarded  Family Communication  :  Bonner Puna - [phone] -  09/11/19, 09/12/19, 09/13/19, 09/14/19, 09/15/19 - clearly explained prognosis is extremely GUARDED.  Code Status :  Full  Diet :   Diet Order            DIET SOFT Room service appropriate? Yes; Fluid consistency: Thin  Diet effective now               Disposition Plan  :  Stay in the Hospital for severe Hypoxic Resp failure   Consults  :  None  Procedures  :    Outside TTE 03/21 -   Left ventricular systolic function is normal.  LV ejection fraction = 55-60%.  No segmental wall motion abnormalities seen in the left ventricle  Left ventricular filling pattern is indeterminate.  The right ventricular systolic function is normal.  There is mild aortic stenosis.  IVC size was mildly dilated.  There is no comparison study available.     CTA - No PE  Leg [place] - No DVT  PUD Prophylaxis :    DVT Prophylaxis  :  Lovenox >> Eliquis  Lab Results  Component Value Date   PLT 181 09/15/2019    Inpatient Medications  Scheduled Meds: . apixaban  5 mg Oral BID  . vitamin C  500 mg Oral Daily  . Chlorhexidine Gluconate Cloth  6 each Topical Daily  . feeding supplement (GLUCERNA SHAKE)  237 mL Oral TID BM  . feeding supplement (PRO-STAT SUGAR FREE 64)  30 mL Oral TID WC  . furosemide  40 mg Intravenous Once  . insulin aspart  0-15 Units Subcutaneous TID WC  . insulin aspart  0-5 Units Subcutaneous QHS  . insulin glargine  32 Units Subcutaneous Daily  . methylPREDNISolone (SOLU-MEDROL) injection  40 mg Intravenous Daily  . metoprolol tartrate  50 mg Oral BID  . potassium chloride  40 mEq Oral Once  . tamsulosin  0.4 mg Oral Daily  . zinc sulfate  220 mg Oral Daily   Continuous Infusions: . doxycycline (VIBRAMYCIN) IV 100 mg (09/15/19 0330)   PRN Meds:.acetaminophen, chlorpheniramine-HYDROcodone,  guaiFENesin-dextromethorphan, hydrALAZINE, Ipratropium-Albuterol, [DISCONTINUED] ondansetron **OR** ondansetron (ZOFRAN) IV, sodium chloride  Antibiotics  :    Anti-infectives (From admission, onward)   Start     Dose/Rate Route  Frequency Ordered Stop   09/11/19 1000  remdesivir 100 mg in sodium chloride 0.9 % 100 mL IVPB  Status:  Discontinued     100 mg 200 mL/hr over 30 Minutes Intravenous Daily 2019/09/22 1349 09-22-2019 1359   09/11/19 1000  remdesivir 100 mg in sodium chloride 0.9 % 100 mL IVPB  Status:  Discontinued     100 mg 200 mL/hr over 30 Minutes Intravenous Daily 09-22-19 1402 2019/09/22 1518   September 22, 2019 1530  doxycycline (VIBRAMYCIN) 100 mg in sodium chloride 0.9 % 250 mL IVPB     100 mg 125 mL/hr over 120 Minutes Intravenous Every 12 hours 2019-09-22 1501     September 22, 2019 1500  remdesivir 200 mg in sodium chloride 0.9% 250 mL IVPB  Status:  Discontinued     200 mg 580 mL/hr over 30 Minutes Intravenous Once 2019/09/22 1402 2019/09/22 1518   09-22-2019 1400  remdesivir 200 mg in sodium chloride 0.9% 250 mL IVPB  Status:  Discontinued     200 mg 580 mL/hr over 30 Minutes Intravenous Once 09/22/2019 1349 2019/09/22 1359       Time Spent in minutes  30   Susa Raring M.D on 09/15/2019 at 12:09 PM  To page go to [email or web address] - password [email or web address]  [facility] -  Office  [phone]    See all Orders from today for further details    Objective:   Vitals:   09/15/19 0626 09/15/19 0929 09/15/19 0940 09/15/19 1141  BP:  128/89 128/89 (!) 124/54  Pulse:  88 91 83  Resp:  18 (!) 23 (!) 21  Temp: 97.8 F (36.6 C) 98 F (36.7 C)  98.3 F (36.8 C)  TempSrc: Oral Oral  Oral  SpO2:  95% 94% 95%  Weight:      Height:        Wt Readings from Last 3 Encounters:  09/12/19 115.2 kg     Intake/Output Summary (Last 24 hours) at 09/15/2019 1209 Last data filed at 09/15/2019 1104 Gross per 24 hour  Intake 1090 ml  Output 2180 ml  Net -1090 ml     Physical Exam  Awake Alert, No new  F.N deficits, Normal affect [place].AT,PERRAL Supple Neck,No JVD, No cervical lymphadenopathy appriciated.  Symmetrical Chest wall movement, Good air movement bilaterally, +ve rales RRR,No Gallops, Rubs or new Murmurs, No Parasternal Heave +ve B.Sounds, Abd Soft, No tenderness, No organomegaly appriciated, No rebound - guarding or rigidity. No Cyanosis, Clubbing or edema, No new Rash or bruise    Data Review:    CBC Recent Labs  Lab 09/11/19 0554 09/12/19 0351 09/13/19 0627 09/14/19 0307 09/15/19 0234  WBC 11.5* 13.3* 12.4* 11.2* 11.3*  HGB 15.3 15.1 15.3 14.8 15.7  HCT 45.1 45.6 46.9 44.6 46.6  PLT 157 199 220 211 181  MCV 86.4 86.9 88.0 88.1 86.5  MCH 29.3 28.8 28.7 29.2 29.1  MCHC 33.9 33.1 32.6 33.2 33.7  RDW 13.5 13.7 13.6 13.3 13.2  LYMPHSABS 0.4* 0.5* 0.4* 0.4* 0.4*  MONOABS 0.3 0.4 0.3 0.4 0.5  EOSABS 0.0 0.0 0.0 0.0 0.0  BASOSABS 0.0 0.0 0.0 0.0 0.0    Chemistries  Recent Labs  Lab 2019-09-22 1146 Sep 22, 2019 1632 09/11/19 0554 09/12/19 0351 09/13/19 0627 09/14/19 0307 09/15/19 0234  NA   < >  --  135 136 137 137 137  K   < >  --  3.5 4.0 3.9 4.2 3.9  CL   < >  --  97* 99 99 101 104  CO2   < >  --  22 23 25 26 25   GLUCOSE   < >  --  302* 335* 290* 329* 168*  BUN   < >  --  17 37* 51* 45* 47*  CREATININE   < >  --  0.84 1.07 0.97 0.98 0.68  CALCIUM   < >  --  8.3* 8.4* 8.4* 8.4* 8.2*  AST   < >  --  28 17 14* 15 16  ALT   < >  --  16 16 17 15 15   ALKPHOS   < >  --  103 112 109 105 102  BILITOT   < >  --  1.3* 1.0 1.1 0.8 0.8  MG  --   --  1.7 2.2 2.4 2.3 2.2  HGBA1C  --  8.5*  --   --   --   --   --    < > = values in this interval not displayed.     ------------------------------------------------------------------------------------------------------------------ No results for input(s): CHOL, HDL, LDLCALC, TRIG, CHOLHDL, LDLDIRECT in the last 72 hours.  Lab Results  Component Value Date   HGBA1C 8.5 (H) 10/03/2019    ------------------------------------------------------------------------------------------------------------------ No results for input(s): TSH, T4TOTAL, T3FREE, THYROIDAB in the last 72 hours.  Invalid input(s): FREET3  Cardiac Enzymes No results for input(s): CKMB, TROPONINI, MYOGLOBIN in the last 168 hours.  Invalid input(s): CK ------------------------------------------------------------------------------------------------------------------    Component Value Date/Time   BNP 237.3 (H) 09/15/2019 0234    Micro Results Recent Results (from the past 240 hour(s))  MRSA PCR Screening     Status: None   Collection Time: 09/11/19  4:30 AM   Specimen: Nasal Mucosa; Nasopharyngeal  Result Value Ref Range Status   MRSA by PCR NEGATIVE NEGATIVE Final    Comment:        The GeneXpert MRSA Assay (FDA approved for NASAL specimens only), is one component of a comprehensive MRSA colonization surveillance program. It is not intended to diagnose MRSA infection nor to guide or monitor treatment for MRSA infections. Performed at [facility], [address] [place], [place] [address]     Radiology Reports CT ANGIO CHEST PE W OR WO CONTRAST  Result Date: 09/11/2019 CLINICAL DATA:  Shortness of breath.  COVID-19 positive EXAM: CT ANGIOGRAPHY CHEST WITH CONTRAST TECHNIQUE: Multidetector CT imaging of the chest was performed using the standard protocol during bolus administration of intravenous contrast. Multiplanar CT image reconstructions and MIPs were obtained to evaluate the vascular anatomy. CONTRAST:  75mL OMNIPAQUE IOHEXOL 350 MG/ML SOLN COMPARISON:  Chest radiograph September 10, 2019 FINDINGS: Cardiovascular: There is no demonstrable pulmonary embolus. There is no thoracic aortic aneurysm or dissection. There are scattered foci of great vessel calcification. There is aortic atherosclerosis. There are multiple foci of coronary artery calcification. There is no pericardial effusion or  pericardial thickening. Mediastinum/Nodes: There is a 6 mm nodular opacity in the left lobe of the thyroid which per consensus guidelines does not warrant additional imaging surveillance. Thyroid otherwise appears unremarkable. There is a 1.4 x 1.1 cm subcarinal lymph node. Elsewhere there are scattered subcentimeter mediastinal lymph nodes which do not meet size criteria for pathologic significance. No esophageal lesions are appreciable. Lungs/Pleura: There is widespread airspace opacity throughout the lungs diffusely with involvement of most lobes in segments. There is mild fibrotic change in the apices. No appreciable pleural effusions. Upper Abdomen: In the visualized upper abdomen, there is aortic atherosclerosis. Visualized upper abdominal structures otherwise appear  unremarkable. Musculoskeletal: There is degenerative change in the mid and lower thoracic spine with diffuse idiopathic skeletal hyperostosis. There old healed rib fractures on the left with remodeling. No blastic or lytic bone lesions. No evident chest wall lesions. Review of the MIP images confirms the above findings. IMPRESSION: 1. No demonstrable pulmonary embolus. No thoracic aortic aneurysm or dissection. There is aortic atherosclerosis as well as foci of great vessel and coronary artery calcification. 2. Widespread airspace opacity throughout the lungs diffusely consistent with widespread multifocal pneumonia. Suspect atypical organism pneumonia, particularly given history. 3. Mildly prominent subcarinal lymph node which may well be of reactive etiology given the extensive lung parenchymal abnormality. No other adenopathy by size criteria. 4.  Diffuse idiopathic skeletal hyperostosis. Aortic Atherosclerosis (ICD10-I70.0). Electronically Signed   By: Bretta Bang III M.D.   On: 09/11/2019 10:00   DG Chest Port 1 View  Result Date: 09/14/2019 CLINICAL DATA:  Shortness of breath with decreased oxygen saturation EXAM: PORTABLE CHEST 1  VIEW COMPARISON:  Chest radiograph September 10, 2019; chest CT angiogram September 11, 2019 FINDINGS: There has been slight interval clearing of airspace opacity from the left lower lobe. Ill-defined airspace opacity throughout much of the right lung is essentially stable. No new opacity evident heart is upper normal in size with pulmonary vascularity normal. No adenopathy. No bone lesions. IMPRESSION: Extensive airspace opacity bilaterally, more on the right than on the left. Slight clearing on the left since recent studies. Stable changes on the right. Stable cardiac silhouette. Electronically Signed   By: Bretta Bang III M.D.   On: 09/14/2019 08:15   DG Chest Port 1 View  Result Date: 09/11/2019 CLINICAL DATA:  Shortness breath with COVID-19 positive status EXAM: PORTABLE CHEST 1 VIEW COMPARISON:  None. FINDINGS: Extensive airspace opacity is noted throughout the right lung as well as in the left lower lung region. Consolidation is noted in portions of the right lower lobe. Heart size and pulmonary vascularity within normal limits. No adenopathy. There are multiple old healed rib fractures on the left. There is degenerative change in each shoulder. IMPRESSION: Multifocal airspace opacity, somewhat more pronounced on the right than on the left with consolidation in the right base. Appearance consistent with multifocal pneumonia. There may be a degree of underlying parenchymal fibrotic type change. Heart size within normal limits. No adenopathy evident. Multiple old healed rib fractures on the left. Electronically Signed   By: Bretta Bang III M.D.   On: 10/01/2019 13:00   VAS [place] LOWER EXTREMITY VENOUS (DVT)  Result Date: 09/13/2019  Lower Venous DVTStudy Indications: Swelling.  Comparison Study: No prior exam. Performing Technologist: Kennedy Bucker ARDMS, RVT  Examination Guidelines: A complete evaluation includes B-mode imaging, spectral Doppler, color Doppler, and power Doppler as needed of all  accessible portions of each vessel. Bilateral testing is considered an integral part of a complete examination. Limited examinations for reoccurring indications may be performed as noted. The reflux portion of the exam is performed with the patient in reverse Trendelenburg.  +---------+---------------+---------+-----------+----------+--------------+ RIGHT    CompressibilityPhasicitySpontaneityPropertiesThrombus Aging +---------+---------------+---------+-----------+----------+--------------+ CFV      Full           Yes      Yes                                 +---------+---------------+---------+-----------+----------+--------------+ SFJ      Full                                                        +---------+---------------+---------+-----------+----------+--------------+  FV Prox  Full                                                        +---------+---------------+---------+-----------+----------+--------------+ FV Mid   Full                                                        +---------+---------------+---------+-----------+----------+--------------+ FV DistalFull                                                        +---------+---------------+---------+-----------+----------+--------------+ PFV      Full                                                        +---------+---------------+---------+-----------+----------+--------------+ POP      Full           Yes      Yes                                 +---------+---------------+---------+-----------+----------+--------------+ PTV      Full                                                        +---------+---------------+---------+-----------+----------+--------------+ PERO     Full                                                        +---------+---------------+---------+-----------+----------+--------------+   +---------+---------------+---------+-----------+----------+--------------+  LEFT     CompressibilityPhasicitySpontaneityPropertiesThrombus Aging +---------+---------------+---------+-----------+----------+--------------+ CFV      Full           Yes      Yes                                 +---------+---------------+---------+-----------+----------+--------------+ SFJ      Full                                                        +---------+---------------+---------+-----------+----------+--------------+ FV Prox  Full                                                        +---------+---------------+---------+-----------+----------+--------------+  FV Mid   Full                                                        +---------+---------------+---------+-----------+----------+--------------+ FV DistalFull                                                        +---------+---------------+---------+-----------+----------+--------------+ PFV      Full                                                        +---------+---------------+---------+-----------+----------+--------------+ POP      Full           Yes      Yes                                 +---------+---------------+---------+-----------+----------+--------------+ PTV      Full                                                        +---------+---------------+---------+-----------+----------+--------------+ PERO     Full                                                        +---------+---------------+---------+-----------+----------+--------------+     Summary: BILATERAL: - No evidence of deep vein thrombosis seen in the lower extremities, bilaterally.  RIGHT: - Hypoechoic, complex structure with no blood flow visualized in popliteal fossa measuring 3.7 x 1.5 x 3.3 cm.  LEFT: - No cystic structure found in the popliteal fossa.  *See table(s) above for measurements and observations. Electronically signed by Gretta Began MD on 09/13/2019 at 8:18:24 AM.    Final

## 2019-09-15 NOTE — Progress Notes (Signed)
Occupational Therapy Treatment Patient Details Name: Curtis Hardy MRN: [id number] DOB: [birth year] Today's Date: 09/15/2019    History of present illness Pt adm with acute hypoxic resp failure due to covid pneumonitis. Pt with recent adm and dc at [facility] [place] for covid. Pt also with new onset afib. PMH - chf, dm, obesity   OT comments  Pt received in bed, reports having been up to chair today and declining further OOB activities. Pt also appears more sullen today, but polite with therapist. With flattened bed, pt able to use bed rails to pull self up without assistance. Pt Setup for washing face bed level. Pt declined oral care, as well as brushing hair/grooming beard citing "it doesn't matter" when OT inquired. Provided encouragement and assistance with brushing hair. Will continue to follow and hope to complete OOB activities during next session for max benefit.    Follow Up Recommendations  SNF    Equipment Recommendations  None recommended by OT    Recommendations for Other Services      Precautions / Restrictions Precautions Precautions: Fall;Other (comment) Precaution Comments: watch SpO2 Restrictions Weight Bearing Restrictions: No       Mobility Bed Mobility Overal bed mobility: Needs Assistance Bed Mobility: (Repositioning/scooting up in bed)           General bed mobility comments: With flat bed, pt able to use bedrails to scoot/pull self up in bed   Transfers                 General transfer comment: pt declined to trial transfers/OOB activities today    Balance                                           ADL either performed or assessed with clinical judgement   ADL Overall ADL's : Needs assistance/impaired     Grooming: Set up;Wash/dry face;Bed level                                       Vision       Perception     Praxis      Cognition Arousal/Alertness: Awake/alert Behavior During Therapy: WFL for tasks  assessed/performed Overall Cognitive Status: Within Functional Limits for tasks assessed                                 General Comments: Pt appears more sullen today, reporting "not a good day", but still polite with therapist         Exercises Exercises: General Lower Extremity Other Exercises Other Exercises: incentive spirometer x 10 reps, max   Shoulder Instructions       General Comments Pt on 25 L HHFNC, 91-96% at rest in bed    Pertinent Vitals/ Pain       Pain Assessment: Faces Faces Pain Scale: No hurt  Home Living                                          Prior Functioning/Environment              Frequency  Min 2X/week  Progress Toward Goals  OT Goals(current goals can now be found in the care plan section)  Progress towards OT goals: Progressing toward goals  Acute Rehab OT Goals Patient Stated Goal: to rest OT Goal Formulation: With patient Time For Goal Achievement: 09/27/19 Potential to Achieve Goals: Good ADL Goals Pt Will Perform Grooming: with min assist;standing Pt Will Perform Upper Body Bathing: with set-up;sitting Pt Will Perform Lower Body Bathing: with min guard assist;sit to/from stand Pt Will Transfer to Toilet: with min assist;ambulating;regular height toilet;bedside commode;grab bars Pt Will Perform Toileting - Clothing Manipulation and hygiene: with min guard assist;sit to/from stand Pt/caregiver will Perform Home Exercise Program: Increased strength;Right Upper extremity;Left upper extremity;With theraband;Independently;With written HEP provided Additional ADL Goal #1: Pt will incorporate energy conservation strategies during ADLs with min cues  Plan Discharge plan remains appropriate    Co-evaluation                 AM-PAC OT "6 Clicks" Daily Activity     Outcome Measure   Help from another person eating meals?: None Help from another person taking care of personal  grooming?: A Little Help from another person toileting, which includes using toliet, bedpan, or urinal?: A Little Help from another person bathing (including washing, rinsing, drying)?: A Little Help from another person to put on and taking off regular upper body clothing?: A Little Help from another person to put on and taking off regular lower body clothing?: A Little 6 Click Score: 19    End of Session Equipment Utilized During Treatment: Oxygen  OT Visit Diagnosis: Unsteadiness on feet (R26.81);Muscle weakness (generalized) (M62.81)   Activity Tolerance Patient limited by fatigue   Patient Left in bed;with call bell/phone within reach   Nurse Communication          Time: 3557-3220 OT Time Calculation (min): 20 min  Charges: OT General Charges $OT Visit: 1 Visit OT Treatments $Self Care/Home Management : 8-22 mins  Layla Maw, OTR/L   Layla Maw 09/15/2019, 3:38 PM

## 2019-09-15 NOTE — Plan of Care (Signed)
  Problem: Respiratory: Goal: Will maintain a patent airway Outcome: Progressing Goal: Complications related to the disease process, condition or treatment will be avoided or minimized Outcome: Progressing   

## 2019-09-15 NOTE — Plan of Care (Signed)

## 2019-09-15 NOTE — Progress Notes (Signed)
Physical Therapy Treatment Note  Patient declining OOB to chair, agreeable to try next session. In bed therex, education on positioning and movement of LEs in bed to decrease stiffness, prevent skin breakdown and increase comfort. Patient verbalized understanding. PT assisted patient with positioning to increase comfort. Continued recommendation for discharge to SNF for short term rehabilitation, pending medical course and prognosis.    09/15/19 1456  PT Visit Information  Last PT Received On 09/15/19  History of Present Illness 74 year old male admitted 09/17/2019 with severe hypoxic respiratory failure requiring 15L [place]. He was recently admitted on 03/19 at [facility] due to COVID-19, new onset A. fib and fluid overload. This admission, patient treated with high dose IV steroids and Actemra but he has already incurred a lot of parenchymal injury during his previous hospitalization at [facility]. Patient is currently on heated high flow and being diuresed. PMH: new onset A. Fib-on Eliquis, diabetes mellitus   Subjective Data  Subjective Patient declines OOB to chair or sitting EOB  Precautions  Precautions Fall;Other (comment)  Precaution Comments monitor oxygen saturation  Restrictions  Weight Bearing Restrictions No  Pain Assessment  Pain Assessment Faces  Faces Pain Scale 0  Cognition  Arousal/Alertness Awake/alert  Behavior During Therapy WFL for tasks assessed/performed  Overall Cognitive Status Within Functional Limits for tasks assessed  Bed Mobility  Overal bed mobility Needs Assistance  General bed mobility comments Patient boosted up in bed with minA and cues for technique. Assisted patient with improved positioning.  Transfers  General transfer comment Patient declined sitting EOB or OOB to chair.  General Comments  General comments (skin integrity, edema, etc.) Patient on 20L heated high flow with oxygen saturation and HR WNL.  Exercises  Exercises  General Lower Extremity  General Exercises - Lower Extremity  Ankle Circles/Pumps AROM;Both;10 reps;Supine  Heel Slides Both;AROM;AAROM;5 reps;Supine  Hip ABduction/ADduction AROM;AAROM;Both;5 reps;Supine  Straight Leg Raises AROM;AAROM;5 reps;Both;Supine ((modified SLR))  Other Exercises  Other Exercises incentive spirometer x 10 reps, max 1237mL  PT - End of Session  Equipment Utilized During Treatment Oxygen  Activity Tolerance Patient limited by fatigue  Patient left in bed;with call bell/phone within reach  Nurse Communication Other (comment) (patient declining OOB)   PT - Assessment/Plan  PT Plan Frequency needs to be updated  PT Visit Diagnosis Unsteadiness on feet (R26.81);Other abnormalities of gait and mobility (R26.89);Muscle weakness (generalized) (M62.81)  PT Frequency (ACUTE ONLY) Min 2X/week  Follow Up Recommendations SNF;Supervision/Assistance - 24 hour  PT equipment Other (comment) (TBD at next level of care)  AM-PAC PT "6 Clicks" Mobility Outcome Measure (Version 2)  Help needed turning from your back to your side while in a flat bed without using bedrails? 3  Help needed moving from lying on your back to sitting on the side of a flat bed without using bedrails? 3  Help needed moving to and from a bed to a chair (including a wheelchair)? 3  Help needed standing up from a chair using your arms (e.g., wheelchair or bedside chair)? 3  Help needed to walk in hospital room? 2  Help needed climbing 3-5 steps with a railing?  2  6 Click Score 16  Consider Recommendation of Discharge To: Home with [facility]  PT Goal Progression  Progress towards PT goals Progressing toward goals  PT Time Calculation  PT Start Time (ACUTE ONLY) 1456  PT Stop Time (ACUTE ONLY) 1507  PT Time Calculation (min) (ACUTE ONLY) 11 min  PT General  Charges  $$ ACUTE PT VISIT 1 Visit  PT Treatments  $Therapeutic Exercise 8-22 mins   Angelene Giovanni, PT, DPT Acute Rehab [phone] office

## 2019-09-16 LAB — GLUCOSE, CAPILLARY
Glucose-Capillary: 194 mg/dL — ABNORMAL HIGH (ref 70–99)
Glucose-Capillary: 207 mg/dL — ABNORMAL HIGH (ref 70–99)
Glucose-Capillary: 258 mg/dL — ABNORMAL HIGH (ref 70–99)
Glucose-Capillary: 217 mg/dL — ABNORMAL HIGH (ref 70–99)

## 2019-09-16 LAB — COMPREHENSIVE METABOLIC PANEL
ALT: 22 U/L (ref 0–44)
AST: 22 U/L (ref 15–41)
Albumin: 2.5 g/dL — ABNORMAL LOW (ref 3.5–5.0)
Alkaline Phosphatase: 124 U/L (ref 38–126)
Anion gap: 12 (ref 5–15)
BUN: 47 mg/dL — ABNORMAL HIGH (ref 8–23)
CO2: 28 mmol/L (ref 22–32)
Calcium: 8.4 mg/dL — ABNORMAL LOW (ref 8.9–10.3)
Chloride: 98 mmol/L (ref 98–111)
Creatinine, Ser: 0.94 mg/dL (ref 0.61–1.24)
GFR calc Af Amer: >60 mL/min (ref >60)
GFR calc non Af Amer: >60 mL/min (ref >60)
Glucose, Bld: 232 mg/dL — ABNORMAL HIGH (ref 70–99)
Potassium: 3.7 mmol/L (ref 3.5–5.1)
Sodium: 138 mmol/L (ref 135–145)
Total Bilirubin: 0.9 mg/dL (ref 0.3–1.2)
Total Protein: 6 g/dL — ABNORMAL LOW (ref 6.5–8.1)

## 2019-09-16 LAB — CBC WITH DIFFERENTIAL/PLATELET
Abs Immature Granulocytes: 0.17 10*3/uL — ABNORMAL HIGH (ref 0.00–0.07)
Basophils Absolute: 0 10*3/uL (ref 0.0–0.1)
Basophils Relative: 0 %
Eosinophils Absolute: 0.2 10*3/uL (ref 0.0–0.5)
Eosinophils Relative: 1 %
HCT: 50.8 % (ref 39.0–52.0)
Hemoglobin: 16.4 g/dL (ref 13.0–17.0)
Immature Granulocytes: 1 %
Lymphocytes Relative: 7 %
Lymphs Abs: 0.9 10*3/uL (ref 0.7–4.0)
MCH: 28.4 pg (ref 26.0–34.0)
MCHC: 32.3 g/dL (ref 30.0–36.0)
MCV: 88 fL (ref 80.0–100.0)
Monocytes Absolute: 0.2 10*3/uL (ref 0.1–1.0)
Monocytes Relative: 2 %
Neutro Abs: 11.1 10*3/uL — ABNORMAL HIGH (ref 1.7–7.7)
Neutrophils Relative %: 89 %
Platelets: 171 10*3/uL (ref 150–400)
RBC: 5.77 MIL/uL (ref 4.22–5.81)
RDW: 13.4 % (ref 11.5–15.5)
WBC: 12.6 10*3/uL — ABNORMAL HIGH (ref 4.0–10.5)
nRBC: 0 % (ref 0.0–0.2)

## 2019-09-16 LAB — D-DIMER, QUANTITATIVE: D-Dimer, Quant: 6.37 ug/mL-FEU — ABNORMAL HIGH (ref 0.00–0.50)

## 2019-09-16 LAB — BRAIN NATRIURETIC PEPTIDE: B Natriuretic Peptide: 135.5 pg/mL — ABNORMAL HIGH (ref 0.0–100.0)

## 2019-09-16 LAB — PROCALCITONIN: Procalcitonin: <0.1 ng/mL

## 2019-09-16 LAB — C-REACTIVE PROTEIN: CRP: 0.9 mg/dL (ref <1.0)

## 2019-09-16 LAB — MAGNESIUM: Magnesium: 2.1 mg/dL (ref 1.7–2.4)

## 2019-09-16 MED ORDER — DOXYCYCLINE HYCLATE 100 MG PO TABS
100.0000 mg | ORAL_TABLET | Freq: Two times a day (BID) | ORAL | Status: DC
  Administered 2019-09-16: 100 mg via ORAL
  Filled 2019-09-16: qty 1

## 2019-09-16 MED ORDER — LORAZEPAM 0.5 MG PO TABS: 0.5000 mg | ORAL_TABLET | Freq: Three times a day (TID) | ORAL | Status: DC | PRN

## 2019-09-16 MED ORDER — LORAZEPAM 2 MG/ML IJ SOLN
0.5000 mg | Freq: Once | INTRAMUSCULAR | Status: AC
  Administered 2019-09-16: 0.5 mg via INTRAVENOUS
  Filled 2019-09-16: qty 1

## 2019-09-16 MED ORDER — HALOPERIDOL LACTATE 5 MG/ML IJ SOLN
2.0000 mg | Freq: Four times a day (QID) | INTRAMUSCULAR | Status: DC | PRN
  Administered 2019-09-16 – 2019-09-17 (×2): 2 mg via INTRAVENOUS
  Filled 2019-09-16 (×2): qty 1

## 2019-09-16 MED ORDER — HALOPERIDOL LACTATE 5 MG/ML IJ SOLN
2.0000 mg | Freq: Once | INTRAMUSCULAR | Status: AC
  Administered 2019-09-16: 2 mg via INTRAVENOUS
  Filled 2019-09-16: qty 1

## 2019-09-16 NOTE — Significant Event (Addendum)
Rapid Response Event Note  Overview: Time Called: 1627 Arrival Time: 1630 Event Type: Respiratory   VS: HR 105, RR 29, SpO2 60% HHFNC reapplied and supplemental oxygen increased upon my arrival CBG 258  Initial Focused Assessment: Pt sitting in chair. Agitated, restless, pulling off supplemental oxygen. Pt disoriented to time, situation, and place. Pt hands and nail beds are blue, face is blue, oxygen saturation 68%. HHFNC increased to 55L 100% and 100% NRB mask applied. Lift used to assist pt back to bed. Lungs are clear, diminished. Air movement heard in all lung fields. Abdomen is soft. Pt is noted to have incontinent BM. HOB elevated >45 degrees. Pt not pink in color. Oxygen weaned back down to 25L 100%, SpO2 94%. Pt RR now 18-20 breaths/min. Pt appears calm, medications given have taken effect. Pt received 0.5mg  IV Ativan at 1519 and 2mg  IV Haldol at 1639. Pt remains disoriented. Accessory muscle use noted, pt no longer in distress.  Interventions: -No interventions from rapid response RN  Plan of Care (if not transferred): -Consider ABG if altered mental status continues -Titrate oxygen based on pt's needs -MD also recommends allowing pt to FaceTime grandson to help reorient him  Event Summary: Name of Physician Notified: Dr. Ghimire(notified by primary RN) Outcome: Stayed in room and stabalized Event End Time: 1700  

## 2019-09-16 NOTE — Progress Notes (Signed)
Patient has increased agitation, yelling, and being physically aggressive to nursing staff stating "leave me alone I do not want you to touch me" Patient addemently refusing to use oxygen, telemetry, and refusing for nurse to help him get back in a comfortable position.  Patient sats currently 60%, Ghimire, MD notified. Waiting for orders.

## 2019-09-16 NOTE — Progress Notes (Signed)
PROGRESS NOTE    LORNE WINKELS  ASN:[id number] DOB: 1946/04/06 DOA: 09/13/2019 PCP: Patient, No Pcp Per    Brief Narrative:  Dennie M Hodginis a 74 y.o.malewith medical history significant ofnew onset A. Fib-on Eliquis, diabetes mellitus presented to emergency department due to worsening of shortness of breath.  Patient recently admitted on 03/19 at [facility] due to COVID-19, new onset A. fib and fluid overload. He was diuresed and received dexamethasone and remdesivir while he was hospitalized. His CHA2DS2-VASc score was 3. Patient discharged home on Eliquis, dexamethasone. He finished dexamethasone on 09/07/19.  ED course: he was found to have severe hypoxic respiratory failure requiring 15 L nasal cannula oxygen and was admitted to the hospital.  This hospitalization he was treated with high-dose IV steroids and Actemra however he had already incurred a lot of parenchymal injury during his previous hospitalization at [facility] and subsequent discharge course from there.  He is still on  high flow oxygen.  Assessment & Plan:   Principal Problem:   Acute hypoxemic respiratory failure due to COVID-19 [facility]) Active Problems:   Diabetes mellitus without complication (HCC)   A-fib (HCC)   HTN (hypertension)  Acute hypoxic respiratory failure due to COVID-19 pneumonia: Continue to monitor due to significant symptoms.  Patient has persistent shortness of breath and hypoxia after finishing initial treatment with remdesivir and steroids. Continue aggressive chest physiotherapy, incentive spirometry, deep breathing exercises, sputum induction, mucolytic's and bronchodilators. Supplemental oxygen to keep saturations more than 85%. Covid directed therapy with , steroids, currently on Solu-Medrol remdesivir, finished antiviral therapy. actemra, received on admission. antibiotics, on doxycycline. Due to severity of symptoms, patient will need daily inflammatory  markers, chest x-rays, liver function test to monitor and direct COVID-19 therapies. Out of bed.  Mobilize.  Paroxysmal atrial fibrillation: Currently sinus rhythm.  On oral Cardizem.  Therapeutic on Eliquis.  Acute on chronic diastolic congestive heart failure: Known ejection fraction 55%.  Intermittently diuresing to help with oxygenation.  Blood pressures less than 100 today.  Unable to tolerate diuresis.  We will continue to monitor for additional diuretics needed.  Acute urinary retention: Probably due to immobility.  Continue Foley catheter until patient is mobile.  Flomax.  Type 2 diabetes, uncontrolled with hyperglycemia: On insulin.  Will taper off steroids, no change in regimen today.   DVT prophylaxis: Eliquis Code Status: Full code Family Communication: Patient's grandson, [name] on the phone Disposition Plan: patient is from home. Anticipated DC to home, Barriers to discharge, is still on significant oxygen.   Consultants:   None  Procedures:   None  Antimicrobials:  Anti-infectives (From admission, onward)   Start     Dose/Rate Route Frequency Ordered Stop   09/16/19 2200  doxycycline (VIBRA-TABS) tablet 100 mg     100 mg Oral Every 12 hours 09/16/19 1123     09/11/19 1000  remdesivir 100 mg in sodium chloride 0.9 % 100 mL IVPB  Status:  Discontinued     100 mg 200 mL/hr over 30 Minutes Intravenous Daily 09/17/2019 1349 10/09/2019 1359   09/11/19 1000  remdesivir 100 mg in sodium chloride 0.9 % 100 mL IVPB  Status:  Discontinued     100 mg 200 mL/hr over 30 Minutes Intravenous Daily 09/24/2019 1402 10/08/2019 1518   09/22/2019 1530  doxycycline (VIBRAMYCIN) 100 mg in sodium chloride 0.9 % 250 mL IVPB  Status:  Discontinued     100 mg 125 mL/hr over 120 Minutes Intravenous Every 12 hours  09/16/2019 1501 09/16/19 1123   09/22/2019 1500  remdesivir 200 mg in sodium chloride 0.9% 250 mL IVPB  Status:  Discontinued     200 mg 580 mL/hr over 30 Minutes Intravenous Once 09/30/2019 1402  09/22/2019 1518   09/17/2019 1400  remdesivir 200 mg in sodium chloride 0.9% 250 mL IVPB  Status:  Discontinued     200 mg 580 mL/hr over 30 Minutes Intravenous Once 10/09/2019 1349 09/27/2019 1359         Subjective: Patient was seen and examined.  No overnight events.  He was sleeping with oxygen cannula on his neck.  Oxygen saturation was 75% on monitor.  He himself says feels slightly better.  Currently on 30 L of oxygen and saturating 86%.  No cough.  Able to keep up conversation.  Objective: Vitals:   09/16/19 0745 09/16/19 0800 09/16/19 0902 09/16/19 1145  BP: 128/81 104/77  90/60  Pulse: 86 87 86 85  Resp: (!) 27 18 (!) 24 (!) 34  Temp: 97.8 F (36.6 C)   97.8 F (36.6 C)  TempSrc: Oral   Oral  SpO2: 90% 92% 93% (!) 88%  Weight:      Height:        Intake/Output Summary (Last 24 hours) at 09/16/2019 1450 Last data filed at 09/16/2019 0700 Gross per 24 hour  Intake 1780 ml  Output 1950 ml  Net -170 ml   Filed Weights   09/11/19 0448 09/12/19 0453 09/16/19 0510  Weight: 114.5 kg 115.2 kg 114.2 kg    Examination:  General exam: Appears calm.  In moderate respiratory distress. Respiratory system: Clear to auscultation.  In moderate respiratory distress.  On 30 L nasal cannula oxygen. Cardiovascular system: S1 & S2 heard, RRR. No JVD, murmurs, rubs, gallops or clicks. Gastrointestinal system: Abdomen is nondistended, soft and nontender. No organomegaly or masses felt. Normal bowel sounds heard. Central nervous system: Alert and oriented. No focal neurological deficits. Extremities: Symmetric 5 x 5 power. Skin: No rashes, lesions or ulcers Psychiatry: Judgement and insight appear normal. Mood & affect anxious.    Data Reviewed: I have personally reviewed following labs and imaging studies  CBC: Recent Labs  Lab 09/12/19 0351 09/13/19 0627 09/14/19 0307 09/15/19 0234 09/16/19 0433  WBC 13.3* 12.4* 11.2* 11.3* 12.6*  NEUTROABS 12.2* 11.6* 10.4* 10.3* 11.1*  HGB 15.1  15.3 14.8 15.7 16.4  HCT 45.6 46.9 44.6 46.6 50.8  MCV 86.9 88.0 88.1 86.5 88.0  PLT 199 220 211 181 171   Basic Metabolic Panel: Recent Labs  Lab 09/11/19 0554 09/11/19 0554 09/12/19 0351 09/13/19 0627 09/14/19 0307 09/15/19 0234 09/16/19 0433  NA 135   < > 136 137 137 137 138  K 3.5   < > 4.0 3.9 4.2 3.9 3.7  CL 97*   < > 99 99 101 104 98  CO2 22   < > 23 25 26 25 28   GLUCOSE 302*   < > 335* 290* 329* 168* 232*  BUN 17   < > 37* 51* 45* 47* 47*  CREATININE 0.84   < > 1.07 0.97 0.98 0.68 0.94  CALCIUM 8.3*   < > 8.4* 8.4* 8.4* 8.2* 8.4*  MG 1.7   < > 2.2 2.4 2.3 2.2 2.1  PHOS 3.4  --   --   --   --   --   --    < > = values in this interval not displayed.   GFR: Estimated Creatinine Clearance: 96 mL/min (  by C-G formula based on SCr of 0.94 mg/dL). Liver Function Tests: Recent Labs  Lab 09/12/19 0351 09/13/19 0627 09/14/19 0307 09/15/19 0234 09/16/19 0433  AST 17 14* 15 16 22   ALT 16 17 15 15 22   ALKPHOS 112 109 105 102 124  BILITOT 1.0 1.1 0.8 0.8 0.9  PROT 6.9 6.8 6.1* 5.7* 6.0*  ALBUMIN 2.3* 2.4* 2.3* 2.3* 2.5*   No results for input(s): LIPASE, AMYLASE in the last 168 hours. No results for input(s): AMMONIA in the last 168 hours. Coagulation Profile: No results for input(s): INR, PROTIME in the last 168 hours. Cardiac Enzymes: No results for input(s): CKTOTAL, CKMB, CKMBINDEX, TROPONINI in the last 168 hours. BNP (last 3 results) No results for input(s): PROBNP in the last 8760 hours. HbA1C: No results for input(s): HGBA1C in the last 72 hours. CBG: Recent Labs  Lab 09/15/19 1148 09/15/19 1700 09/15/19 2148 09/16/19 0748 09/16/19 1148  GLUCAP 184* 295* 363* 194* 207*   Lipid Profile: No results for input(s): CHOL, HDL, LDLCALC, TRIG, CHOLHDL, LDLDIRECT in the last 72 hours. Thyroid Function Tests: No results for input(s): TSH, T4TOTAL, FREET4, T3FREE, THYROIDAB in the last 72 hours. Anemia Panel: No results for input(s): VITAMINB12, FOLATE,  FERRITIN, TIBC, IRON, RETICCTPCT in the last 72 hours. Sepsis Labs: Recent Labs  Lab 09-11-2019 1330 09-11-2019 1632 09-11-19 2121 09/11/19 0554 09/13/19 0627 09/14/19 0307 09/15/19 0234 09/16/19 0433  PROCALCITON  --   --   --    < > 0.26 0.17 0.10 <0.10  LATICACIDVEN 2.4* 2.6* 3.1*  --   --   --   --   --    < > = values in this interval not displayed.    Recent Results (from the past 240 hour(s))  MRSA PCR Screening     Status: None   Collection Time: 09/11/19  4:30 AM   Specimen: Nasal Mucosa; Nasopharyngeal  Result Value Ref Range Status   MRSA by PCR NEGATIVE NEGATIVE Final    Comment:        The GeneXpert MRSA Assay (FDA approved for NASAL specimens only), is one component of a comprehensive MRSA colonization surveillance program. It is not intended to diagnose MRSA infection nor to guide or monitor treatment for MRSA infections. Performed at [facility], [place] [address], [place], [place] [address]          Radiology Studies: No results found.      Scheduled Meds: . apixaban  5 mg Oral BID  . vitamin C  500 mg Oral Daily  . Chlorhexidine Gluconate Cloth  6 each Topical Daily  . doxycycline  100 mg Oral Q12H  . feeding supplement (GLUCERNA SHAKE)  237 mL Oral TID BM  . feeding supplement (PRO-STAT SUGAR FREE 64)  30 mL Oral TID WC  . insulin aspart  0-15 Units Subcutaneous TID WC  . insulin aspart  0-5 Units Subcutaneous QHS  . insulin glargine  32 Units Subcutaneous Daily  . methylPREDNISolone (SOLU-MEDROL) injection  40 mg Intravenous Daily  . metoprolol tartrate  50 mg Oral BID  . tamsulosin  0.4 mg Oral Daily  . zinc sulfate  220 mg Oral Daily   Continuous Infusions:   LOS: 6 days    Time spent: 30 minutes    Barb Merino, MD [facility] Pager [phone]

## 2019-09-16 NOTE — Progress Notes (Signed)
Patient agitated and addimently refusing to use oxygen. patinet current oxygen saturation 60%. Notified Ghimire, MD. Waiting for orders.

## 2019-09-16 NOTE — Progress Notes (Signed)
Inpatient Diabetes Program Recommendations  AACE/ADA: New Consensus Statement on Inpatient Glycemic Control (2015)  Target Ranges:  Prepandial:   less than 140 mg/dL      Peak postprandial:   less than 180 mg/dL (1-2 hours)      Critically ill patients:  140 - 180 mg/dL   Lab Results  Component Value Date   GLUCAP 194 (H) 09/16/2019   HGBA1C 8.5 (H) September 13, 2019    Review of Glycemic Control Results for EBERARDO, DEMELLO (MRN [id number]) as of 09/16/2019 11:48  Ref. Range 09/15/2019 08:01 09/15/2019 11:48 09/15/2019 17:00 09/15/2019 21:48 09/16/2019 07:48  Glucose-Capillary Latest Ref Range: 70 - 99 mg/dL 201 (H) 007 (H) 121 (H) 363 (H) 194 (H)   Diabetes history: DM 2 Outpatient Diabetes medications:  Glipizide XL 5 mg daily, Humulin 70/30 10 units daily Current orders for Inpatient glycemic control:  Novolog moderate tid with meals and HS Lantus 32 units daily Solumedrol 40 mg IV daily Glucerna 237 ml tid with meals  Inpatient Diabetes Program Recommendations:    May consider adding Novolog meal coverage 6 units tid with meals (hold if patient eats less than 50%).   Thanks,  Christena Deem RN, MSN, BC-ADM Inpatient Diabetes Coordinator Team Pager [phone] (8a-5p)

## 2019-09-17 ENCOUNTER — Inpatient Hospital Stay (HOSPITAL_COMMUNITY): Payer: Medicare Other

## 2019-09-17 DIAGNOSIS — J9601 Acute respiratory failure with hypoxia: Secondary | ICD-10-CM

## 2019-09-17 DIAGNOSIS — U071 COVID-19: Principal | ICD-10-CM

## 2019-09-17 DIAGNOSIS — J80 Acute respiratory distress syndrome: Secondary | ICD-10-CM

## 2019-09-17 DIAGNOSIS — Z515 Encounter for palliative care: Secondary | ICD-10-CM

## 2019-09-17 LAB — CBC WITH DIFFERENTIAL/PLATELET
Abs Immature Granulocytes: 0.15 10*3/uL — ABNORMAL HIGH (ref 0.00–0.07)
Basophils Absolute: 0 10*3/uL (ref 0.0–0.1)
Basophils Relative: 0 %
Eosinophils Absolute: 0.2 10*3/uL (ref 0.0–0.5)
Eosinophils Relative: 1 %
HCT: 52 % (ref 39.0–52.0)
Hemoglobin: 17.7 g/dL — ABNORMAL HIGH (ref 13.0–17.0)
Immature Granulocytes: 1 %
Lymphocytes Relative: 6 %
Lymphs Abs: 0.8 10*3/uL (ref 0.7–4.0)
MCH: 29.7 pg (ref 26.0–34.0)
MCHC: 34 g/dL (ref 30.0–36.0)
MCV: 87.2 fL (ref 80.0–100.0)
Monocytes Absolute: 0.3 10*3/uL (ref 0.1–1.0)
Monocytes Relative: 2 %
Neutro Abs: 13.2 10*3/uL — ABNORMAL HIGH (ref 1.7–7.7)
Neutrophils Relative %: 90 %
Platelets: 147 10*3/uL — ABNORMAL LOW (ref 150–400)
RBC: 5.96 MIL/uL — ABNORMAL HIGH (ref 4.22–5.81)
RDW: 13.5 % (ref 11.5–15.5)
WBC: 14.7 10*3/uL — ABNORMAL HIGH (ref 4.0–10.5)
nRBC: 0 % (ref 0.0–0.2)

## 2019-09-17 LAB — PROCALCITONIN: Procalcitonin: 0.13 ng/mL

## 2019-09-17 LAB — COMPREHENSIVE METABOLIC PANEL
ALT: 24 U/L (ref 0–44)
AST: 23 U/L (ref 15–41)
Albumin: 2.6 g/dL — ABNORMAL LOW (ref 3.5–5.0)
Alkaline Phosphatase: 107 U/L (ref 38–126)
Anion gap: 11 (ref 5–15)
BUN: 43 mg/dL — ABNORMAL HIGH (ref 8–23)
CO2: 26 mmol/L (ref 22–32)
Calcium: 8.4 mg/dL — ABNORMAL LOW (ref 8.9–10.3)
Chloride: 106 mmol/L (ref 98–111)
Creatinine, Ser: 0.9 mg/dL (ref 0.61–1.24)
GFR calc Af Amer: >60 mL/min (ref >60)
GFR calc non Af Amer: >60 mL/min (ref >60)
Glucose, Bld: 83 mg/dL (ref 70–99)
Potassium: 3.8 mmol/L (ref 3.5–5.1)
Sodium: 143 mmol/L (ref 135–145)
Total Bilirubin: 1.3 mg/dL — ABNORMAL HIGH (ref 0.3–1.2)
Total Protein: 6 g/dL — ABNORMAL LOW (ref 6.5–8.1)

## 2019-09-17 LAB — BLOOD GAS, ARTERIAL
Acid-Base Excess: 3 mmol/L — ABNORMAL HIGH (ref 0.0–2.0)
Acid-Base Excess: 2.7 mmol/L — ABNORMAL HIGH (ref 0.0–2.0)
Bicarbonate: 26.1 mmol/L (ref 20.0–28.0)
Bicarbonate: 25.7 mmol/L (ref 20.0–28.0)
Drawn by: 519031
FIO2: 100
FIO2: 100
O2 Saturation: 98.9 %
O2 Saturation: 92.1 %
Patient temperature: 36.4
Patient temperature: 36.7
pCO2 arterial: 33 mmHg (ref 32.0–48.0)
pCO2 arterial: 31.6 mmHg — ABNORMAL LOW (ref 32.0–48.0)
pH, Arterial: 7.507 — ABNORMAL HIGH (ref 7.350–7.450)
pH, Arterial: 7.518 — ABNORMAL HIGH (ref 7.350–7.450)
pO2, Arterial: 140 mmHg — ABNORMAL HIGH (ref 83.0–108.0)
pO2, Arterial: 60.5 mmHg — ABNORMAL LOW (ref 83.0–108.0)

## 2019-09-17 LAB — D-DIMER, QUANTITATIVE: D-Dimer, Quant: 14.27 ug/mL-FEU — ABNORMAL HIGH (ref 0.00–0.50)

## 2019-09-17 LAB — MRSA PCR SCREENING: MRSA by PCR: NEGATIVE

## 2019-09-17 LAB — BRAIN NATRIURETIC PEPTIDE: B Natriuretic Peptide: 130.1 pg/mL — ABNORMAL HIGH (ref 0.0–100.0)

## 2019-09-17 LAB — GLUCOSE, CAPILLARY
Glucose-Capillary: 113 mg/dL — ABNORMAL HIGH (ref 70–99)
Glucose-Capillary: 215 mg/dL — ABNORMAL HIGH (ref 70–99)
Glucose-Capillary: 76 mg/dL (ref 70–99)
Glucose-Capillary: 85 mg/dL (ref 70–99)

## 2019-09-17 LAB — MAGNESIUM: Magnesium: 2.3 mg/dL (ref 1.7–2.4)

## 2019-09-17 LAB — C-REACTIVE PROTEIN: CRP: 0.7 mg/dL (ref <1.0)

## 2019-09-17 MED ORDER — ACETAMINOPHEN 325 MG PO TABS: 650.0000 mg | ORAL_TABLET | Freq: Four times a day (QID) | ORAL | Status: DC | PRN

## 2019-09-17 MED ORDER — METOPROLOL TARTRATE 5 MG/5ML IV SOLN: 2.5000 mg | INTRAVENOUS | Status: DC | PRN

## 2019-09-17 MED ORDER — ONDANSETRON HCL 4 MG/2ML IJ SOLN: 4.0000 mg | Freq: Four times a day (QID) | INTRAMUSCULAR | Status: DC | PRN

## 2019-09-17 MED ORDER — LORAZEPAM 2 MG/ML PO CONC: 1.0000 mg | ORAL | Status: DC | PRN

## 2019-09-17 MED ORDER — PROCHLORPERAZINE EDISYLATE 10 MG/2ML IJ SOLN: 10.0000 mg | Freq: Two times a day (BID) | INTRAMUSCULAR | Status: DC | PRN

## 2019-09-17 MED ORDER — LORAZEPAM 1 MG PO TABS: 1.0000 mg | ORAL_TABLET | ORAL | Status: DC | PRN

## 2019-09-17 MED ORDER — MORPHINE 100MG IN NS 100ML (1MG/ML) PREMIX INFUSION
5.0000 mg/h | INTRAVENOUS | Status: DC
  Administered 2019-09-17 – 2019-09-18 (×2): 5 mg/h via INTRAVENOUS
  Filled 2019-09-17 (×2): qty 100

## 2019-09-17 MED ORDER — GLYCOPYRROLATE 0.2 MG/ML IJ SOLN: 0.2000 mg | INTRAMUSCULAR | Status: DC | PRN

## 2019-09-17 MED ORDER — SODIUM CHLORIDE 0.9 % IV SOLN
2.0000 g | Freq: Three times a day (TID) | INTRAVENOUS | Status: DC
  Administered 2019-09-17: 2 g via INTRAVENOUS
  Filled 2019-09-17: qty 2

## 2019-09-17 MED ORDER — FUROSEMIDE 10 MG/ML IJ SOLN
40.0000 mg | Freq: Once | INTRAMUSCULAR | Status: AC
  Administered 2019-09-17: 40 mg via INTRAVENOUS
  Filled 2019-09-17: qty 4

## 2019-09-17 MED ORDER — ACETAMINOPHEN 650 MG RE SUPP: 650.0000 mg | Freq: Four times a day (QID) | RECTAL | Status: DC | PRN

## 2019-09-17 MED ORDER — PIPERACILLIN-TAZOBACTAM 3.375 G IVPB: 3.3750 g | Freq: Three times a day (TID) | INTRAVENOUS | Status: DC

## 2019-09-17 MED ORDER — SODIUM CHLORIDE 0.9% FLUSH
3.0000 mL | Freq: Two times a day (BID) | INTRAVENOUS | Status: DC
  Administered 2019-09-18: 3 mL via INTRAVENOUS

## 2019-09-17 MED ORDER — LORAZEPAM 2 MG/ML IJ SOLN: 1.0000 mg | INTRAMUSCULAR | Status: DC | PRN

## 2019-09-17 MED ORDER — PIPERACILLIN-TAZOBACTAM 3.375 G IVPB 30 MIN: 3.3750 g | Freq: Four times a day (QID) | INTRAVENOUS | Status: DC

## 2019-09-17 MED ORDER — MORPHINE SULFATE (CONCENTRATE) 10 MG/0.5ML PO SOLN: 5.0000 mg | ORAL | Status: DC | PRN

## 2019-09-17 MED ORDER — ONDANSETRON 4 MG PO TBDP: 4.0000 mg | ORAL_TABLET | Freq: Four times a day (QID) | ORAL | Status: DC | PRN

## 2019-09-17 MED ORDER — SODIUM CHLORIDE 0.9 % IV SOLN: 250.0000 mL | INTRAVENOUS | Status: DC | PRN

## 2019-09-17 MED ORDER — ENOXAPARIN SODIUM 120 MG/0.8ML ~~LOC~~ SOLN
1.0000 mg/kg | Freq: Two times a day (BID) | SUBCUTANEOUS | Status: DC
  Administered 2019-09-17: 110 mg via SUBCUTANEOUS
  Filled 2019-09-17 (×2): qty 0.73

## 2019-09-17 MED ORDER — SODIUM CHLORIDE 0.9% FLUSH: 3.0000 mL | INTRAVENOUS | Status: DC | PRN

## 2019-09-17 MED ORDER — HALOPERIDOL LACTATE 2 MG/ML PO CONC
0.5000 mg | ORAL | Status: DC | PRN
  Filled 2019-09-17: qty 0.3

## 2019-09-17 MED ORDER — POLYVINYL ALCOHOL 1.4 % OP SOLN
1.0000 [drp] | Freq: Four times a day (QID) | OPHTHALMIC | Status: DC | PRN
  Filled 2019-09-17: qty 15

## 2019-09-17 MED ORDER — HALOPERIDOL 0.5 MG PO TABS
0.5000 mg | ORAL_TABLET | ORAL | Status: DC | PRN
  Filled 2019-09-17: qty 1

## 2019-09-17 MED ORDER — PROCHLORPERAZINE 25 MG RE SUPP
25.0000 mg | Freq: Two times a day (BID) | RECTAL | Status: DC | PRN
  Filled 2019-09-17: qty 1

## 2019-09-17 MED ORDER — INSULIN GLARGINE 100 UNIT/ML ~~LOC~~ SOLN
20.0000 [IU] | Freq: Every day | SUBCUTANEOUS | Status: DC
  Administered 2019-09-17: 20 [IU] via SUBCUTANEOUS
  Filled 2019-09-17: qty 0.2

## 2019-09-17 MED ORDER — BIOTENE DRY MOUTH MT LIQD: 15.0000 mL | OROMUCOSAL | Status: DC | PRN

## 2019-09-17 MED ORDER — HALOPERIDOL LACTATE 5 MG/ML IJ SOLN: 0.5000 mg | INTRAMUSCULAR | Status: DC | PRN

## 2019-09-17 MED ORDER — MORPHINE BOLUS VIA INFUSION
4.0000 mg | Freq: Once | INTRAVENOUS | Status: AC
  Administered 2019-09-17: 4 mg via INTRAVENOUS
  Filled 2019-09-17: qty 4

## 2019-09-17 MED ORDER — GLYCOPYRROLATE 1 MG PO TABS: 1.0000 mg | ORAL_TABLET | ORAL | Status: DC | PRN

## 2019-09-17 MED ORDER — VANCOMYCIN HCL 1250 MG/250ML IV SOLN
1250.0000 mg | Freq: Two times a day (BID) | INTRAVENOUS | Status: DC
  Administered 2019-09-17: 1250 mg via INTRAVENOUS
  Filled 2019-09-17: qty 250

## 2019-09-17 MED ORDER — DIPHENHYDRAMINE HCL 50 MG/ML IJ SOLN: 12.5000 mg | INTRAMUSCULAR | Status: DC | PRN

## 2019-09-17 MED ORDER — HALOPERIDOL LACTATE 5 MG/ML IJ SOLN: 2.0000 mg | INTRAMUSCULAR | Status: DC | PRN

## 2019-09-17 NOTE — Progress Notes (Signed)
PT Cancellation Note  Patient Details Name: Curtis Hardy MRN: [id number] DOB: [birth year]   Cancelled Treatment:    Reason Eval/Treat Not Completed: Medical issues which prohibited therapy;Other (comment)(rapid response, patient may go comfort care)  Patient with rapid response this afternoon, strict bedrest orders now, per critical care note, patient may go full comfort care.   Angelene Giovanni 09/17/2019, 2:47 PM

## 2019-09-17 NOTE — Progress Notes (Signed)
PROGRESS NOTE    Curtis Hardy  WLN:[id number] DOB: Sep 20, 1945 DOA: 2019/09/11 PCP: Patient, No Pcp Per    Brief Narrative:  Curtis Hardy a 74 y.o.malewith medical history significant ofnew onset A. Fib-on Eliquis, diabetes mellitus presented to emergency department due to worsening of shortness of breath.  Patient recently admitted on 03/19 at [facility] due to COVID-19, new onset A. fib and fluid overload. He was diuresed and received dexamethasone and remdesivir while he was hospitalized. His CHA2DS2-VASc score was 3. Patient discharged home on Eliquis, dexamethasone. He finished dexamethasone on 09/07/19.  ED course:09/11/19  he was found to have severe hypoxic respiratory failure requiring 15 L nasal cannula oxygen and was admitted to the hospital.  This hospitalization he was treated with high-dose IV steroids and Actemra however he had already incurred a lot of parenchymal injury during his previous hospitalization at [facility] and subsequent discharge course from there.  He is still on  high flow oxygen.  Hospital course is complicated by high requirement of oxygen and confusion and agitation.  Assessment & Plan:   Principal Problem:   Acute hypoxemic respiratory failure due to COVID-19 [facility]) Active Problems:   Diabetes mellitus without complication (HCC)   A-fib (HCC)   HTN (hypertension)  Acute hypoxic respiratory failure due to COVID-19 pneumonia: Continue to monitor due to significant symptoms.  Patient has persistent shortness of breath and hypoxia after finishing initial treatment with remdesivir and steroids. Continue aggressive chest physiotherapy, incentive spirometry, deep breathing exercises, sputum induction, mucolytic's and bronchodilators as much as he can do. Supplemental oxygen to keep saturations more than 85%. Covid directed therapy with , steroids, currently on Solu-Medrol remdesivir, finished antiviral therapy. actemra,  received on admission. antibiotics, was on doxycycline.  Unable to take p.o. medication today due to encephalopathy.  Will collect blood cultures today and treat with broad-spectrum antibiotics with vancomycin and cefepime until clinical improvement. Due to severity of symptoms, patient will need daily inflammatory markers, chest x-rays, liver function test to monitor and direct COVID-19 therapies. Out of bed.  Mobilize as much able.  Acute metabolic encephalopathy: Due to COVID-19 infection and hypoxia.  No focal deficit.  Fall precautions.  Soft medical restraints to protect lines and to keep the oxygen on.  Low-dose of haloperidol to control behavior, help medication administration and fall precautions.  Paroxysmal atrial fibrillation: Currently sinus rhythm.  On oral Cardizem and beta-blockers.  Therapeutic on Eliquis.  Unable to take by mouth.  Will change to therapeutic Lovenox today.  Acute on chronic diastolic congestive heart failure: Known ejection fraction 55%.  Intermittently diuresing to help with oxygenation.  1 dose of Lasix today.  Acute urinary retention: Probably due to immobility.  Continue Foley catheter until patient is mobile.  Flomax.  Type 2 diabetes, uncontrolled with hyperglycemia: On insulin.  No reliable intake.  Decrease dose of long-acting insulin today.   DVT prophylaxis: Eliquis, changing to Eliquis. Code Status: Full code Family Communication: Patient's grandson, Curtis Ruiz called unable to talk, will call.  Disposition Plan: patient is from home. Anticipated DC to home, Barriers to discharge, is still on significant oxygen.   Consultants:   None  Procedures:   None  Antimicrobials:  Anti-infectives (From admission, onward)   Start     Dose/Rate Route Frequency Ordered Stop   09/17/19 1200  piperacillin-tazobactam (ZOSYN) IVPB 3.375 g  Status:  Discontinued     3.375 g 100 mL/hr over 30 Minutes Intravenous Every 6 hours 09/17/19 0951  09/17/19 1002    09/17/19 1100  vancomycin (VANCOREADY) IVPB 1250 mg/250 mL     1,250 mg 166.7 mL/hr over 90 Minutes Intravenous Every 12 hours 09/17/19 0959     09/17/19 1030  piperacillin-tazobactam (ZOSYN) IVPB 3.375 g  Status:  Discontinued     3.375 g 12.5 mL/hr over 240 Minutes Intravenous Every 8 hours 09/17/19 1002 09/17/19 1003   09/17/19 1030  ceFEPIme (MAXIPIME) 2 g in sodium chloride 0.9 % 100 mL IVPB     2 g 200 mL/hr over 30 Minutes Intravenous Every 8 hours 09/17/19 1004     09/16/19 2200  doxycycline (VIBRA-TABS) tablet 100 mg  Status:  Discontinued     100 mg Oral Every 12 hours 09/16/19 1123 09/17/19 0950   09/11/19 1000  remdesivir 100 mg in sodium chloride 0.9 % 100 mL IVPB  Status:  Discontinued     100 mg 200 mL/hr over 30 Minutes Intravenous Daily Sep 18, 2019 1349 September 18, 2019 1359   09/11/19 1000  remdesivir 100 mg in sodium chloride 0.9 % 100 mL IVPB  Status:  Discontinued     100 mg 200 mL/hr over 30 Minutes Intravenous Daily 09/15/2019 1402 10/03/2019 1518   18-Sep-2019 1530  doxycycline (VIBRAMYCIN) 100 mg in sodium chloride 0.9 % 250 mL IVPB  Status:  Discontinued     100 mg 125 mL/hr over 120 Minutes Intravenous Every 12 hours 09/26/2019 1501 09/16/19 1123   09/16/2019 1500  remdesivir 200 mg in sodium chloride 0.9% 250 mL IVPB  Status:  Discontinued     200 mg 580 mL/hr over 30 Minutes Intravenous Once 09/17/2019 1402 Sep 18, 2019 1518   09/13/2019 1400  remdesivir 200 mg in sodium chloride 0.9% 250 mL IVPB  Status:  Discontinued     200 mg 580 mL/hr over 30 Minutes Intravenous Once 09/23/2019 1349 09/24/2019 1359         Subjective: Patient seen and examined.  Overnight, remained confused and occasionally agitated.  Not keeping him oxygen.  Needed soft restraints to protect lines and keep oxygen flowing. Seen in the morning rounds, lethargic, can answer few questions, difficult to keep up conversation.  Objective: Vitals:   09/17/19 0405 09/17/19 0500 09/17/19 0750 09/17/19 1009  BP: (!)  103/57  116/73 (!) 146/87  Pulse: 97  (!) 101 (!) 113  Resp: 17  (!) 25 (!) 25  Temp: 97.7 F (36.5 C)  98 F (36.7 C)   TempSrc: Axillary  Oral   SpO2: (!) 86%  (!) 87% (!) 87%  Weight:  109.6 kg    Height:        Intake/Output Summary (Last 24 hours) at 09/17/2019 1042 Last data filed at 09/17/2019 0405 Gross per 24 hour  Intake --  Output 1100 ml  Net -1100 ml   Filed Weights   09/12/19 0453 09/16/19 0510 09/17/19 0500  Weight: 115.2 kg 114.2 kg 109.6 kg    Examination:  General exam: Appears uncomfortable, agitated and confused.  In moderate respiratory distress. Respiratory system: Clear to auscultation.  In moderate respiratory distress.  On 25 L nasal cannula oxygen. Cardiovascular system: S1 & S2 heard, RRR.  Tachycardic.  No JVD, murmurs, rubs, gallops or clicks.  Gastrointestinal system: Abdomen is nondistended, soft and nontender. No organomegaly or masses felt. Normal bowel sounds heard. Central nervous system:  No focal neurological deficits. Extremities: Symmetric 5 x 5 power. Skin: No rashes, lesions or ulcers Psychiatry: Judgement and insight appear compromised.  Mood & affect anxious and agitated.  Data Reviewed: I have personally reviewed following labs and imaging studies  CBC: Recent Labs  Lab 09/13/19 0627 09/14/19 0307 09/15/19 0234 09/16/19 0433 09/17/19 0328  WBC 12.4* 11.2* 11.3* 12.6* 14.7*  NEUTROABS 11.6* 10.4* 10.3* 11.1* 13.2*  HGB 15.3 14.8 15.7 16.4 17.7*  HCT 46.9 44.6 46.6 50.8 52.0  MCV 88.0 88.1 86.5 88.0 87.2  PLT 220 211 181 171 875*   Basic Metabolic Panel: Recent Labs  Lab 09/11/19 0554 09/12/19 0351 09/13/19 0627 09/14/19 0307 09/15/19 0234 09/16/19 0433 09/17/19 0328  NA 135   < > 137 137 137 138 143  K 3.5   < > 3.9 4.2 3.9 3.7 3.8  CL 97*   < > 99 101 104 98 106  CO2 22   < > 25 26 25 28 26   GLUCOSE 302*   < > 290* 329* 168* 232* 83  BUN 17   < > 51* 45* 47* 47* 43*  CREATININE 0.84   < > 0.97 0.98 0.68  0.94 0.90  CALCIUM 8.3*   < > 8.4* 8.4* 8.2* 8.4* 8.4*  MG 1.7   < > 2.4 2.3 2.2 2.1 2.3  PHOS 3.4  --   --   --   --   --   --    < > = values in this interval not displayed.   GFR: Estimated Creatinine Clearance: 98.3 mL/min (by C-G formula based on SCr of 0.9 mg/dL). Liver Function Tests: Recent Labs  Lab 09/13/19 0627 09/14/19 0307 09/15/19 0234 09/16/19 0433 09/17/19 0328  AST 14* 15 16 22 23   ALT 17 15 15 22 24   ALKPHOS 109 105 102 124 107  BILITOT 1.1 0.8 0.8 0.9 1.3*  PROT 6.8 6.1* 5.7* 6.0* 6.0*  ALBUMIN 2.4* 2.3* 2.3* 2.5* 2.6*   No results for input(s): LIPASE, AMYLASE in the last 168 hours. No results for input(s): AMMONIA in the last 168 hours. Coagulation Profile: No results for input(s): INR, PROTIME in the last 168 hours. Cardiac Enzymes: No results for input(s): CKTOTAL, CKMB, CKMBINDEX, TROPONINI in the last 168 hours. BNP (last 3 results) No results for input(s): PROBNP in the last 8760 hours. HbA1C: No results for input(s): HGBA1C in the last 72 hours. CBG: Recent Labs  Lab 09/16/19 1707 09/16/19 2017 09/17/19 0021 09/17/19 0403 09/17/19 0722  GLUCAP 258* 217* 85 76 113*   Lipid Profile: No results for input(s): CHOL, HDL, LDLCALC, TRIG, CHOLHDL, LDLDIRECT in the last 72 hours. Thyroid Function Tests: No results for input(s): TSH, T4TOTAL, FREET4, T3FREE, THYROIDAB in the last 72 hours. Anemia Panel: No results for input(s): VITAMINB12, FOLATE, FERRITIN, TIBC, IRON, RETICCTPCT in the last 72 hours. Sepsis Labs: Recent Labs  Lab 09/21/2019 1330 09/17/2019 1632 10/06/2019 2121 09/11/19 0554 09/14/19 0307 09/15/19 0234 09/16/19 0433 09/17/19 0328  PROCALCITON  --   --   --    < > 0.17 0.10 <0.10 0.13  LATICACIDVEN 2.4* 2.6* 3.1*  --   --   --   --   --    < > = values in this interval not displayed.    Recent Results (from the past 240 hour(s))  MRSA PCR Screening     Status: None   Collection Time: 09/11/19  4:30 AM   Specimen: Nasal  Mucosa; Nasopharyngeal  Result Value Ref Range Status   MRSA by PCR NEGATIVE NEGATIVE Final    Comment:        The GeneXpert MRSA Assay (FDA approved for NASAL  specimens only), is one component of a comprehensive MRSA colonization surveillance program. It is not intended to diagnose MRSA infection nor to guide or monitor treatment for MRSA infections. Performed at [facility], [address], [place], [place] [address]          Radiology Studies: DG CHEST PORT 1 VIEW  Result Date: 09/17/2019 CLINICAL DATA:  Follow-up COVID-19 pneumonia EXAM: PORTABLE CHEST 1 VIEW COMPARISON:  09/14/2019 FINDINGS: Cardiac shadow is stable. Persistent opacities are noted bilaterally right greater than left consistent with the given clinical history. Multiple left rib fractures are seen. Degenerative change of the thoracic spine is noted. IMPRESSION: Stable appearance of the chest given some technical variations in the imaging. Electronically Signed   By: Alcide Clever M.D.   On: 09/17/2019 09:05        Scheduled Meds:  vitamin C  500 mg Oral Daily   Chlorhexidine Gluconate Cloth  6 each Topical Daily   enoxaparin (LOVENOX) injection  1 mg/kg Subcutaneous BID   feeding supplement (GLUCERNA SHAKE)  237 mL Oral TID BM   feeding supplement (PRO-STAT SUGAR FREE 64)  30 mL Oral TID WC   insulin aspart  0-15 Units Subcutaneous TID WC   insulin aspart  0-5 Units Subcutaneous QHS   insulin glargine  20 Units Subcutaneous Daily   methylPREDNISolone (SOLU-MEDROL) injection  40 mg Intravenous Daily   metoprolol tartrate  50 mg Oral BID   tamsulosin  0.4 mg Oral Daily   zinc sulfate  220 mg Oral Daily   Continuous Infusions:  ceFEPime (MAXIPIME) IV     vancomycin       LOS: 7 days    Time spent: 30 minutes    Dorcas Carrow, MD [facility] Pager [phone]

## 2019-09-17 NOTE — Progress Notes (Signed)
Morphine drip initiated at 1711. Dual signed off with Pricilla Larsson, RN. Bolus administered via pump. Family at bedside. Will continue to monitor.

## 2019-09-17 NOTE — Progress Notes (Signed)
RT NOTES:  ABG obtained and sent to lab. Lab tech Kim notified. 

## 2019-09-17 NOTE — Consult Note (Addendum)
NAME:  Curtis Hardy, MRN:  [id number], DOB:  12-24-1945, LOS: 7 ADMISSION DATE:  09/16/2019, CONSULTATION DATE:  09/17/19 REFERRING MD:  Dr. Jerral Ralph, CHIEF COMPLAINT:  SHOB   Brief History   Curtis Hardy  74 y.o male with DM, atiral fibrillation and COVID-19 PNA diagnosed in 08/2019 who presented to the ED with progressive SHOB. He was found to have severe hypoxic respiratory failure requiring 15L/min [place]. Over the last 24 hours he has had progressive hypoxic respiratory failure and encephalopathy. PCCM was consulted for further recommendations.   History of present illness   Curtis Hardy 74 y.o male with DM, atiral fibrillation and COVID-19 PNA diagnosed in 08/2019 who presented to the ED with progressive SHOB. Patient recently admitted on 03/19 at [facility] due to COVID-19, new onset A. fib and fluid overload. He was diuresed and received dexamethasone and remdesivir while he was hospitalized. His CHA2DS2-VASc score was 3. Patient discharged home on Eliquis, dexamethasone. He finished dexamethasone on 09/07/19. He presented to the [facility] ED on 4/1 and was found to have severe hypoxic respiratory failure requiring 15L/min [place]. He has been treated with high dose IV steroids and Actemra; however, continues to have progressive oxygen requirements. He is now requiring 25L/min [place], has increased WOB, and worsening encephalopathy.   Past Medical History   Past Medical History:  Diagnosis Date  . A-fib (HCC)   . CHF (congestive heart failure) (HCC)   . Diabetes mellitus without complication (HCC)    Significant Hospital Events   4/1: Admitted for hypoxic respiratory failure (15L/min [place]) 4/7-4/8: Worsening encephalopathy and hypoxic respiratory failure (25L/min [place])  Consults:  PCCM  Procedures:  None  Significant Diagnostic Tests:  4/2 CTA chest >> 1. No demonstrable pulmonary embolus. No thoracic aortic aneurysm or dissection. There is aortic atherosclerosis as well as foci  of great vessel and coronary artery calcification.  2. Widespread airspace opacity throughout the lungs diffusely consistent with widespread multifocal pneumonia. Suspect atypical organism pneumonia, particularly given history.  3. Mildly prominent subcarinal lymph node which may well be of reactive etiology given the extensive lung parenchymal abnormality. No other adenopathy by size criteria.  4.  Diffuse idiopathic skeletal hyperostosis.  Micro Data:  4/8 Blood cultures>> Pending  Antimicrobials:  4/8 Vancomycin >>  Interim history/subjective:  See above  Objective   Blood pressure (!) 146/87, pulse (!) 113, temperature 98 F (36.7 C), temperature source Oral, resp. rate (!) 25, height 6' 2.41" (1.89 m), weight 109.6 kg, SpO2 (!) 87 %.    FiO2 (%):  [100 %] 100 %   Intake/Output Summary (Last 24 hours) at 09/17/2019 1319 Last data filed at 09/17/2019 1044 Gross per 24 hour  Intake --  Output 2100 ml  Net -2100 ml   Filed Weights   09/12/19 0453 09/16/19 0510 09/17/19 0500  Weight: 115.2 kg 114.2 kg 109.6 kg   Examination: General: Obese male in respiratory distress HENT: Normocephalic, atraumatic, moist mucus membranes Pulm: Diminished air movement, paradoxical breathing  CV: Tachycardic, irregular, no murmurs, no rubs  Abdomen: Active bowel sounds, soft, non-distended, no tenderness to palpation  Extremities: Pulses palpable in all extremities, no LE edema  Skin: Cold, clammy  Neuro: Awakes to verbal stimuli, lethargic and confused  Resolved Hospital Problem list   None  Assessment & Plan:   Acute hypoxic respiratory failure due to COVID-19 pneumonia: - Progressive hypoxic respiratory failure, increased WOB, and encephalopathy over the last 24 hours in the setting of severe ARDS. Given  comorbidities and age he is high risk for mortality.  - Discussed patient's course with the grandson, [name]. After discussing the high risk of mortality even with  intubation, [name] and family elected to make the patient DNR/DNI focusing primarily on comfort - Jonny Ruiz Bradley's wife is heading to the hospital and he is about 2 hours away. We will continue current scope of care without escalation while Jonny Ruiz attempts to get to the hospital  - Please allow visitors  - Comfort care orders place.   Best practice:  Diet: NPO Pain/Anxiety/Delirium protocol (if indicated): PRN Morphine VAP protocol (if indicated): None DVT prophylaxis: SCDs GI prophylaxis: *None Glucose control: CBG Mobility: Bed Rest Code Status: DNR/DNI Family Communication: Spoke with the patient's grandson, [name]  Disposition: Progressive  Labs   CBC: Recent Labs  Lab 09/13/19 0627 09/14/19 0307 09/15/19 0234 09/16/19 0433 09/17/19 0328  WBC 12.4* 11.2* 11.3* 12.6* 14.7*  NEUTROABS 11.6* 10.4* 10.3* 11.1* 13.2*  HGB 15.3 14.8 15.7 16.4 17.7*  HCT 46.9 44.6 46.6 50.8 52.0  MCV 88.0 88.1 86.5 88.0 87.2  PLT 220 211 181 171 147*    Basic Metabolic Panel: Recent Labs  Lab 09/11/19 0554 09/12/19 0351 09/13/19 0627 09/14/19 0307 09/15/19 0234 09/16/19 0433 09/17/19 0328  NA 135   < > 137 137 137 138 143  K 3.5   < > 3.9 4.2 3.9 3.7 3.8  CL 97*   < > 99 101 104 98 106  CO2 22   < > 25 26 25 28 26   GLUCOSE 302*   < > 290* 329* 168* 232* 83  BUN 17   < > 51* 45* 47* 47* 43*  CREATININE 0.84   < > 0.97 0.98 0.68 0.94 0.90  CALCIUM 8.3*   < > 8.4* 8.4* 8.2* 8.4* 8.4*  MG 1.7   < > 2.4 2.3 2.2 2.1 2.3  PHOS 3.4  --   --   --   --   --   --    < > = values in this interval not displayed.   GFR: Estimated Creatinine Clearance: 98.3 mL/min (by C-G formula based on SCr of 0.9 mg/dL). Recent Labs  Lab 10-03-19 1330 10/03/19 1632 Oct 03, 2019 2121 09/11/19 0554 09/14/19 0307 09/15/19 0234 09/16/19 0433 09/17/19 0328  PROCALCITON  --   --   --    < > 0.17 0.10 <0.10 0.13  WBC  --   --   --    < > 11.2* 11.3* 12.6* 14.7*  LATICACIDVEN 2.4* 2.6* 3.1*  --   --   --    --   --    < > = values in this interval not displayed.    Liver Function Tests: Recent Labs  Lab 09/13/19 0627 09/14/19 0307 09/15/19 0234 09/16/19 0433 09/17/19 0328  AST 14* 15 16 22 23   ALT 17 15 15 22 24   ALKPHOS 109 105 102 124 107  BILITOT 1.1 0.8 0.8 0.9 1.3*  PROT 6.8 6.1* 5.7* 6.0* 6.0*  ALBUMIN 2.4* 2.3* 2.3* 2.5* 2.6*   No results for input(s): LIPASE, AMYLASE in the last 168 hours. No results for input(s): AMMONIA in the last 168 hours.  ABG    Component Value Date/Time   PHART 7.518 (H) 09/17/2019 1254   PCO2ART 31.6 (L) 09/17/2019 1254   PO2ART 60.5 (L) 09/17/2019 1254   HCO3 25.7 09/17/2019 1254   O2SAT 92.1 09/17/2019 1254     Coagulation Profile: No results for input(s):  INR, PROTIME in the last 168 hours.  Cardiac Enzymes: No results for input(s): CKTOTAL, CKMB, CKMBINDEX, TROPONINI in the last 168 hours.  HbA1C: Hgb A1c MFr Bld  Date/Time Value Ref Range Status  09/20/2019 04:32 PM 8.5 (H) 4.8 - 5.6 % Final    Comment:    (NOTE) Pre diabetes:          5.7%-6.4% Diabetes:              >6.4% Glycemic control for   <7.0% adults with diabetes     CBG: Recent Labs  Lab 09/16/19 2017 09/17/19 0021 09/17/19 0403 09/17/19 0722 09/17/19 1205  GLUCAP 217* 85 76 113* 215*    Review of Systems:   Unable to obtain due to AMS.   Past Medical History  He,  has a past medical history of A-fib ([place]), CHF (congestive heart failure) ([place]), and Diabetes mellitus without complication ([place]).   Surgical History   History reviewed. No pertinent surgical history.   Social History   reports that he has never smoked. He has never used smokeless tobacco. He reports previous alcohol use. He reports that he does not use drugs.   Family History   His family history is not on file.   Allergies No Known Allergies   Home Medications  Prior to Admission medications   Medication Sig Start Date End Date Taking? Authorizing Provider  apixaban (ELIQUIS)  5 MG TABS tablet Take 5 mg by mouth in the morning and at bedtime. 08/30/19  Yes [provider]  glipiZIDE (GLUCOTROL XL) 5 MG 24 hr tablet Take 5 mg by mouth daily. 08/05/18  Yes [provider]  ibuprofen (ADVIL) 200 MG tablet Take 400 mg by mouth every 6 (six) hours as needed for moderate pain.   Yes [provider]  insulin isophane & regular human (HUMULIN 70/30 KWIKPEN) (70-30) 100 UNIT/ML KwikPen Inject 10 Units into the skin daily.   Yes [provider]       Ina Homes, MD  [facility] PGY3  Pager: [phone]

## 2019-09-17 NOTE — Plan of Care (Signed)
  Problem: Respiratory: Goal: Will maintain a patent airway Outcome: Progressing Goal: Complications related to the disease process, condition or treatment will be avoided or minimized Outcome: Progressing   Problem: Coping: Goal: Level of anxiety will decrease Outcome: Progressing   Problem: Safety: Goal: Ability to remain free from injury will improve Outcome: Progressing   Problem: Skin Integrity: Goal: Risk for impaired skin integrity will decrease Outcome: Progressing

## 2019-09-17 NOTE — Progress Notes (Addendum)
1829 - Patient continues to rest comfortably in bed. Respirations are about 8/min and labored.  Patient resting comfortably in bed.  HHFNC at 25L/100% O2 in place and Morphine gtt infusing at 24ml/hr.    Grandson and wife here to visit.

## 2019-09-17 NOTE — Progress Notes (Addendum)
Assumed care from previous RN. Patient has ICU level of care with incomplete comfort care orders. Dr. Jerral Ralph paged to clarify. Verbal order received to transfer to med-surg. Dr. Jerral Ralph to come to bedside to speak with family and complete comfort care orders.

## 2019-09-17 NOTE — Progress Notes (Signed)
Family at bedside visiting patient. Comfort measures orders in place and cardiac monitoring removed from patient. Awaiting pharmacy to send morphine gtt. Will continue to monitor.  Lyndal Pulley, RN 09/17/2019 4:41 PM

## 2019-09-17 NOTE — Progress Notes (Addendum)
Pharmacy Antibiotic Note  Curtis Hardy is a 74 y.o. male admitted on 09/29/2019 with sepsis.  Pharmacy has been consulted for vanc/cefepime dosing.  Pt has been here for COVID and has been on doxy for his PNA. He got his remdesivir and dexa at [facility]. He still requires significant O2 support. Vanc and cefepime have been ordered for sepsis/PNA.  Scr 0.9 Wbc 14.7 (on steroids)  Since he can't take PO apixaban, we are changing his apixaban to lovenox  Plan: Vanc 1.25g IV q12>>AUC 455, scr 0.9 Cefepime 2g IV q8 MRSA PCR Levels as needed Lovenox 110mg  SQ BID  Height: 6' 2.41" (189 cm) Weight: 109.6 kg (241 lb 10 oz) IBW/kg (Calculated) : 83.14  Temp (24hrs), Avg:97.7 F (36.5 C), Min:97.5 F (36.4 C), Max:98 F (36.7 C)  Recent Labs  Lab 09/29/2019 1330 09/12/2019 1632 09/24/2019 2121 09/11/19 0554 09/13/19 0627 09/14/19 0307 09/15/19 0234 09/16/19 0433 09/17/19 0328  WBC  --   --   --    < > 12.4* 11.2* 11.3* 12.6* 14.7*  CREATININE  --   --   --    < > 0.97 0.98 0.68 0.94 0.90  LATICACIDVEN 2.4* 2.6* 3.1*  --   --   --   --   --   --    < > = values in this interval not displayed.    Estimated Creatinine Clearance: 98.3 mL/min (by C-G formula based on SCr of 0.9 mg/dL).    No Known Allergies  Antimicrobials this admission: 4/1 doxy>>4/7 4/8 vanc>> 4/8 cefepime>>  Dose adjustments this admission:   Microbiology results: 4/2 MRSA PCR>>neg 4/8 blood>>  6/8, PharmD, [place], AAHIVP, CPP Infectious Disease Pharmacist 09/17/2019 10:25 AM

## 2019-09-17 NOTE — Progress Notes (Signed)
Patient with worsening hypoxia and encephalopathy, no meaningful improvement and worsening symptoms. Also seen by PCCM. Met with patient's son, grand son Jenny Reichmann and [name]'s wife at the bedside. Critical illness explained.   Plan: Comfort care  All symptom control medicine available. Will start on morphine drip 60m/hour and as needed ativan and haldol. He will probably die in the hospital. RN to pronounce death if happens here. Less likely he will survive hospice transfer.   Patient Active Problem List   Diagnosis Date Noted  . End of life care 09/17/2019  . Comfort measures only status 09/17/2019  . Acute respiratory distress syndrome (ARDS) due to COVID-19 virus ([place] 09/17/2019  . Encephalopathy due to COVID-19 virus 09/22/2019  . Diabetes mellitus without complication ([place] [employer]   . [place]-fib ([place]   . HTN (hypertension)

## 2019-09-17 NOTE — Significant Event (Addendum)
Rapid Response Event Note  Overview: Pt seen as f/o to day shift RR event. Pt was seen by myself on 4/4 and was alert and oriented, pleasant. Day shift RR RN saw pt on 4/7 and pt was confused, agitated, and trying to pull off oxygen-this required restraint administration.  Initial Focused Assessment: On my assessment tonight, pt will open eyes when spoken to but goes back to sleep easily, will not follow commands or speak. Pt has an increased WOB (this WOB isn't any worse than when I have seen him previously). Lungs diminished t/o. Skin cool to touch. T-97.5, HR-84, BP-127/88, 26, SpO2-87% on HHFNC 25L 100%.   Interventions: ABG Plan of Care (if not transferred): Await ABG results and relay abnormalities to MD. Continue to monitor pt. Call RRT if further assistance needed.  Event Summary: Time called: Pt seen as f/o from day shift, 0100 Arrived:0100 MD notified: RN to notify MD End:0120 Outcome:stayed and stabilized   Terrilyn Saver

## 2019-09-17 NOTE — Significant Event (Signed)
Rapid Response Event Note  Patient mottled especially face and upper chest.  He is cold and clammy.  He is currently resting comfortably with mild inc WOB but has had episodes of sever inc WOB.  Lung sounds with crackles.  He mildly lethargic and disoriented.   BP  99/63  SR 83  RR 22 O2 sat 92% on Heated HFNC  ABG drawn by RT   7.52/32/60/25  Levora Dredge CCM resident also at bedside to assess patient. Spoke with grandson and attending via phone.  Decision was made for no intubation,  DNR.   Marcellina Millin

## 2019-09-17 NOTE — Progress Notes (Signed)
Restraints D/C'd and removed from patient

## 2019-09-18 DIAGNOSIS — J80 Acute respiratory distress syndrome: Secondary | ICD-10-CM

## 2019-09-22 LAB — CULTURE, BLOOD (ROUTINE X 2)
Culture: NO GROWTH
Culture: NO GROWTH
Special Requests: ADEQUATE
Special Requests: ADEQUATE

## 2019-10-10 NOTE — Progress Notes (Signed)
Chaplain responded to Spiritual Consult.  Family at bedside attending end of life.  Family denied needing Chaplain intervention or assistance at this time.  Vernell Morgans Chaplain Resident

## 2019-10-10 NOTE — Progress Notes (Signed)
Patient expired at 13:28.  Absence of respirations and heart beat verified by two RN's.  MD and family have been made aware.  Family in one more time to bedside. Shawnie Pons, RN and Clayton Lefort, RN

## 2019-10-10 NOTE — Death Summary Note (Signed)
DEATH SUMMARY   Patient Details  Name: Curtis Hardy MRN: [id number] DOB: 1945/10/23  Admission/Discharge Information   Admit Date:  09/29/19  Date of Death:    Time of Death:    Length of Stay: 8  Referring Physician: Patient, No Pcp Per   Reason(s) for Hospitalization  Acute respiratory distress syndrome due to COVID-19 pneumonia  Diagnoses  Preliminary cause of death:  Secondary Diagnoses (including complications and co-morbidities):  Principal Problem:   Acute respiratory distress syndrome (ARDS) due to COVID-19 virus [facility]) Active Problems:   Encephalopathy due to COVID-19 virus   Diabetes mellitus without complication (HCC)   A-fib (HCC)   HTN (hypertension)   End of life care   Comfort measures only status   Brief Hospital Course (including significant findings, care, treatment, and services provided and events leading to death)  Curtis Hardy is a 74 y.o. year old male who developed shortness of breath for few days, went to [facility] for treatment, found to have COVID-19 pneumonia where he was treated with steroids and remdesivir.  He was also found to have new onset A. fib, he was started on treatment along with Eliquis and discharged home on 3/29. After 2 days at home, continue to feeling unwell.  He started feeling worsening shortness of breath and was brought to emergency room on 29-Sep-2019.  He was found to have severe hypoxic respiratory failure requiring 15 L of nasal cannula oxygen and admitted to the hospital.  His presentation was consistent with ARDS secondary to COVID-19 infection.  Again treated with high-dose IV steroids and Actemra, along with repeated course of remdesivir and antibiotics.  Patient was treated with maximum therapy despite which he continued to deteriorate.  He continued to require high amount of oxygen, started developing confusion and agitation. On 09/17/2019, remained on 30 L of high flow oxygen, confused and agitated, hypoxic.   With no meaningful recovery and terminal stage as well as his previous wishes not to go on life support and ventilator family decided to pursue comfort care measures.  Patient was treated in the hospital with comfort care measures, end-of-life care was provided and he ultimately died in the hospital on 07-Oct-2019 at 1328 with family at the bedside.    Pertinent Labs and Studies  Significant Diagnostic Studies CT ANGIO CHEST PE W OR WO CONTRAST  Result Date: 09/11/2019 CLINICAL DATA:  Shortness of breath.  COVID-19 positive EXAM: CT ANGIOGRAPHY CHEST WITH CONTRAST TECHNIQUE: Multidetector CT imaging of the chest was performed using the standard protocol during bolus administration of intravenous contrast. Multiplanar CT image reconstructions and MIPs were obtained to evaluate the vascular anatomy. CONTRAST:  74mL OMNIPAQUE IOHEXOL 350 MG/ML SOLN COMPARISON:  Chest radiograph 09-29-19 FINDINGS: Cardiovascular: There is no demonstrable pulmonary embolus. There is no thoracic aortic aneurysm or dissection. There are scattered foci of great vessel calcification. There is aortic atherosclerosis. There are multiple foci of coronary artery calcification. There is no pericardial effusion or pericardial thickening. Mediastinum/Nodes: There is a 6 mm nodular opacity in the left lobe of the thyroid which per consensus guidelines does not warrant additional imaging surveillance. Thyroid otherwise appears unremarkable. There is a 1.4 x 1.1 cm subcarinal lymph node. Elsewhere there are scattered subcentimeter mediastinal lymph nodes which do not meet size criteria for pathologic significance. No esophageal lesions are appreciable. Lungs/Pleura: There is widespread airspace opacity throughout the lungs diffusely with involvement of most lobes in segments. There is mild fibrotic change in the  apices. No appreciable pleural effusions. Upper Abdomen: In the visualized upper abdomen, there is aortic atherosclerosis.  Visualized upper abdominal structures otherwise appear unremarkable. Musculoskeletal: There is degenerative change in the mid and lower thoracic spine with diffuse idiopathic skeletal hyperostosis. There old healed rib fractures on the left with remodeling. No blastic or lytic bone lesions. No evident chest wall lesions. Review of the MIP images confirms the above findings. IMPRESSION: 1. No demonstrable pulmonary embolus. No thoracic aortic aneurysm or dissection. There is aortic atherosclerosis as well as foci of great vessel and coronary artery calcification. 2. Widespread airspace opacity throughout the lungs diffusely consistent with widespread multifocal pneumonia. Suspect atypical organism pneumonia, particularly given history. 3. Mildly prominent subcarinal lymph node which may well be of reactive etiology given the extensive lung parenchymal abnormality. No other adenopathy by size criteria. 4.  Diffuse idiopathic skeletal hyperostosis. Aortic Atherosclerosis (ICD10-I70.0). Electronically Signed   By: Bretta Bang III M.D.   On: 09/11/2019 10:00   DG CHEST PORT 1 VIEW  Result Date: 09/17/2019 CLINICAL DATA:  Follow-up COVID-19 pneumonia EXAM: PORTABLE CHEST 1 VIEW COMPARISON:  09/14/2019 FINDINGS: Cardiac shadow is stable. Persistent opacities are noted bilaterally right greater than left consistent with the given clinical history. Multiple left rib fractures are seen. Degenerative change of the thoracic spine is noted. IMPRESSION: Stable appearance of the chest given some technical variations in the imaging. Electronically Signed   By: Alcide Clever M.D.   On: 09/17/2019 09:05   DG Chest Port 1 View  Result Date: 09/14/2019 CLINICAL DATA:  Shortness of breath with decreased oxygen saturation EXAM: PORTABLE CHEST 1 VIEW COMPARISON:  Chest radiograph September 10, 2019; chest CT angiogram September 11, 2019 FINDINGS: There has been slight interval clearing of airspace opacity from the left lower lobe.  Ill-defined airspace opacity throughout much of the right lung is essentially stable. No new opacity evident heart is upper normal in size with pulmonary vascularity normal. No adenopathy. No bone lesions. IMPRESSION: Extensive airspace opacity bilaterally, more on the right than on the left. Slight clearing on the left since recent studies. Stable changes on the right. Stable cardiac silhouette. Electronically Signed   By: Bretta Bang III M.D.   On: 09/14/2019 08:15   DG Chest Port 1 View  Result Date: 09/25/2019 CLINICAL DATA:  Shortness breath with COVID-19 positive status EXAM: PORTABLE CHEST 1 VIEW COMPARISON:  None. FINDINGS: Extensive airspace opacity is noted throughout the right lung as well as in the left lower lung region. Consolidation is noted in portions of the right lower lobe. Heart size and pulmonary vascularity within normal limits. No adenopathy. There are multiple old healed rib fractures on the left. There is degenerative change in each shoulder. IMPRESSION: Multifocal airspace opacity, somewhat more pronounced on the right than on the left with consolidation in the right base. Appearance consistent with multifocal pneumonia. There may be a degree of underlying parenchymal fibrotic type change. Heart size within normal limits. No adenopathy evident. Multiple old healed rib fractures on the left. Electronically Signed   By: Bretta Bang III M.D.   On: 10/09/2019 13:00   VAS [place] LOWER EXTREMITY VENOUS (DVT)  Result Date: 09/13/2019  Lower Venous DVTStudy Indications: Swelling.  Comparison Study: No prior exam. Performing Technologist: Kennedy Bucker ARDMS, RVT  Examination Guidelines: A complete evaluation includes B-mode imaging, spectral Doppler, color Doppler, and power Doppler as needed of all accessible portions of each vessel. Bilateral testing is considered an integral part of a complete examination.  Limited examinations for reoccurring indications may be performed as  noted. The reflux portion of the exam is performed with the patient in reverse Trendelenburg.  +---------+---------------+---------+-----------+----------+--------------+ RIGHT    CompressibilityPhasicitySpontaneityPropertiesThrombus Aging +---------+---------------+---------+-----------+----------+--------------+ CFV      Full           Yes      Yes                                 +---------+---------------+---------+-----------+----------+--------------+ SFJ      Full                                                        +---------+---------------+---------+-----------+----------+--------------+ FV Prox  Full                                                        +---------+---------------+---------+-----------+----------+--------------+ FV Mid   Full                                                        +---------+---------------+---------+-----------+----------+--------------+ FV DistalFull                                                        +---------+---------------+---------+-----------+----------+--------------+ PFV      Full                                                        +---------+---------------+---------+-----------+----------+--------------+ POP      Full           Yes      Yes                                 +---------+---------------+---------+-----------+----------+--------------+ PTV      Full                                                        +---------+---------------+---------+-----------+----------+--------------+ PERO     Full                                                        +---------+---------------+---------+-----------+----------+--------------+   +---------+---------------+---------+-----------+----------+--------------+ LEFT     CompressibilityPhasicitySpontaneityPropertiesThrombus Aging +---------+---------------+---------+-----------+----------+--------------+ CFV      Full  Yes      Yes                                 +---------+---------------+---------+-----------+----------+--------------+ SFJ      Full                                                        +---------+---------------+---------+-----------+----------+--------------+ FV Prox  Full                                                        +---------+---------------+---------+-----------+----------+--------------+ FV Mid   Full                                                        +---------+---------------+---------+-----------+----------+--------------+ FV DistalFull                                                        +---------+---------------+---------+-----------+----------+--------------+ PFV      Full                                                        +---------+---------------+---------+-----------+----------+--------------+ POP      Full           Yes      Yes                                 +---------+---------------+---------+-----------+----------+--------------+ PTV      Full                                                        +---------+---------------+---------+-----------+----------+--------------+ PERO     Full                                                        +---------+---------------+---------+-----------+----------+--------------+     Summary: BILATERAL: - No evidence of deep vein thrombosis seen in the lower extremities, bilaterally.  RIGHT: - Hypoechoic, complex structure with no blood flow visualized in popliteal fossa measuring 3.7 x 1.5 x 3.3 cm.  LEFT: - No cystic structure found in the popliteal fossa.  *See table(s) above for measurements and observations. Electronically signed by Gretta Began MD on 09/13/2019 at 8:18:24 AM.  Final     Microbiology Recent Results (from the past 240 hour(s))  MRSA PCR Screening     Status: None   Collection Time: 09/11/19  4:30 AM   Specimen: Nasal Mucosa; Nasopharyngeal  Result  Value Ref Range Status   MRSA by PCR NEGATIVE NEGATIVE Final    Comment:        The GeneXpert MRSA Assay (FDA approved for NASAL specimens only), is one component of a comprehensive MRSA colonization surveillance program. It is not intended to diagnose MRSA infection nor to guide or monitor treatment for MRSA infections. Performed at [facility], [address] [place], [place] [address]   MRSA PCR Screening     Status: None   Collection Time: 09/17/19 10:34 AM   Specimen: Nasal Mucosa; Nasopharyngeal  Result Value Ref Range Status   MRSA by PCR NEGATIVE NEGATIVE Final    Comment:        The GeneXpert MRSA Assay (FDA approved for NASAL specimens only), is one component of a comprehensive MRSA colonization surveillance program. It is not intended to diagnose MRSA infection nor to guide or monitor treatment for MRSA infections. Performed at [facility], [address] [place], [place] [address]   Culture, blood (routine x 2)     Status: None (Preliminary result)   Collection Time: 09/17/19 10:35 AM   Specimen: BLOOD LEFT HAND  Result Value Ref Range Status   Specimen Description BLOOD LEFT HAND  Final   Special Requests   Final    BOTTLES DRAWN AEROBIC ONLY Blood Culture adequate volume   Culture   Final    NO GROWTH < 24 HOURS Performed at [facility], [address] [place], [place] [address]    Report Status PENDING  Incomplete  Culture, blood (routine x 2)     Status: None (Preliminary result)   Collection Time: 09/17/19 10:38 AM   Specimen: BLOOD  Result Value Ref Range Status   Specimen Description BLOOD LEFT ANTECUBITAL  Final   Special Requests   Final    BOTTLES DRAWN AEROBIC ONLY Blood Culture adequate volume   Culture   Final    NO GROWTH < 24 HOURS Performed at [facility], [address] [place], [place] [address]    Report Status PENDING  Incomplete    Lab Basic Metabolic Panel: Recent Labs  Lab 09/13/19 0627  09/14/19 0307 09/15/19 0234 09/16/19 0433 09/17/19 0328  NA 137 137 137 138 143  K 3.9 4.2 3.9 3.7 3.8  CL 99 101 104 98 106  CO2 25 26 25 28 26   GLUCOSE 290* 329* 168* 232* 83  BUN 51* 45* 47* 47* 43*  CREATININE 0.97 0.98 0.68 0.94 0.90  CALCIUM 8.4* 8.4* 8.2* 8.4* 8.4*  MG 2.4 2.3 2.2 2.1 2.3   Liver Function Tests: Recent Labs  Lab 09/13/19 0627 09/14/19 0307 09/15/19 0234 09/16/19 0433 09/17/19 0328  AST 14* 15 16 22 23   ALT 17 15 15 22 24   ALKPHOS 109 105 102 124 107  BILITOT 1.1 0.8 0.8 0.9 1.3*  PROT 6.8 6.1* 5.7* 6.0* 6.0*  ALBUMIN 2.4* 2.3* 2.3* 2.5* 2.6*   No results for input(s): LIPASE, AMYLASE in the last 168 hours. No results for input(s): AMMONIA in the last 168 hours. CBC: Recent Labs  Lab 09/13/19 0627 09/14/19 0307 09/15/19 0234 09/16/19 0433 09/17/19 0328  WBC 12.4* 11.2* 11.3* 12.6* 14.7*  NEUTROABS 11.6* 10.4* 10.3* 11.1* 13.2*  HGB 15.3 14.8 15.7 16.4 17.7*  HCT 46.9 44.6 46.6 50.8 52.0  MCV 88.0 88.1 86.5 88.0 87.2  PLT 220 211 181 171 147*   Cardiac Enzymes: No results for input(s): CKTOTAL, CKMB, CKMBINDEX, TROPONINI in the last 168 hours. Sepsis Labs: Recent Labs  Lab 09/14/19 0307 09/15/19 0234 09/16/19 0433 09/17/19 0328  PROCALCITON 0.17 0.10 <0.10 0.13  WBC 11.2* 11.3* 12.6* 14.7*    Procedures/Operations     Angelo Caroll 09/12/2019, 1:43 PM

## 2019-10-10 DEATH — deceased

## 2022-01-07 IMAGING — CT CT ANGIO CHEST
2 of 6 series · 17 of 46 positions shown · IV contrast (omnipaque)
Comparison: Chest radiograph September 10, 2019

CLINICAL DATA: Shortness of breath.  [id number] positive

EXAM:
CT ANGIOGRAPHY CHEST WITH CONTRAST
TECHNIQUE: Multidetector CT imaging of the chest was performed using the
standard protocol during bolus administration of intravenous
contrast. Multiplanar CT image reconstructions and MIPs were
obtained to evaluate the vascular anatomy.
CONTRAST:  75mL OMNIPAQUE IOHEXOL 350 MG/ML SOLN

[Series 6: thins · axial · 0.90mm/px · z∈[+1095,+1343]mm · 14 of 272 slices shown]
[im 12/272  lung]
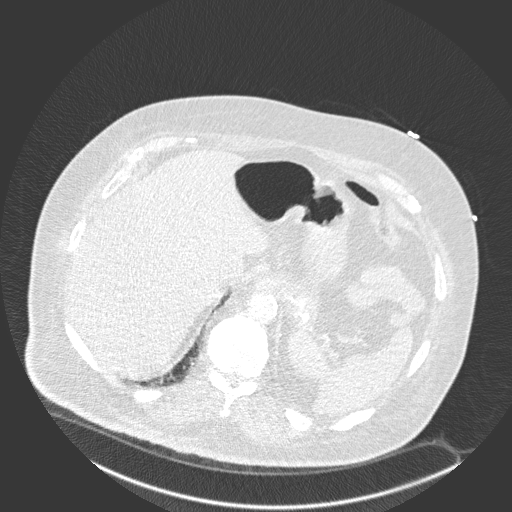
[im 36/272  soft-tissue]
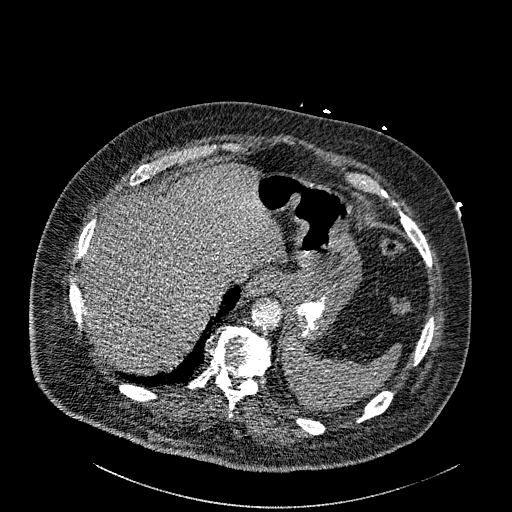
[im 48/272  lung]
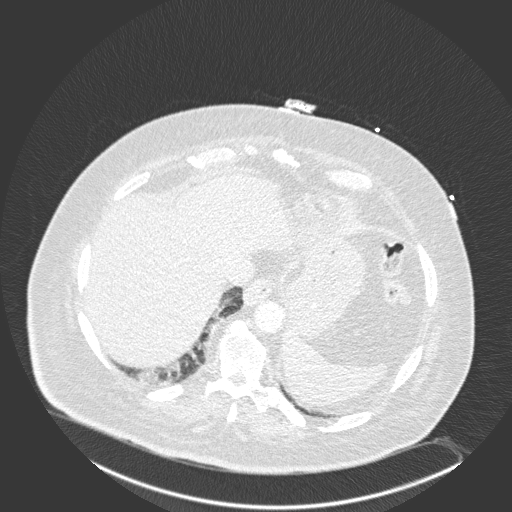
[im 71/272  soft-tissue]
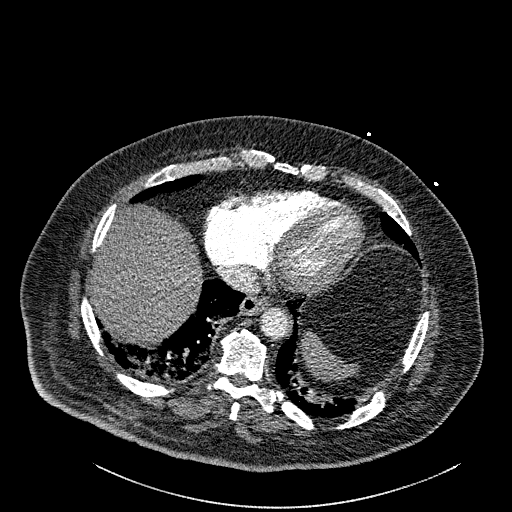
[im 95/272  lung]
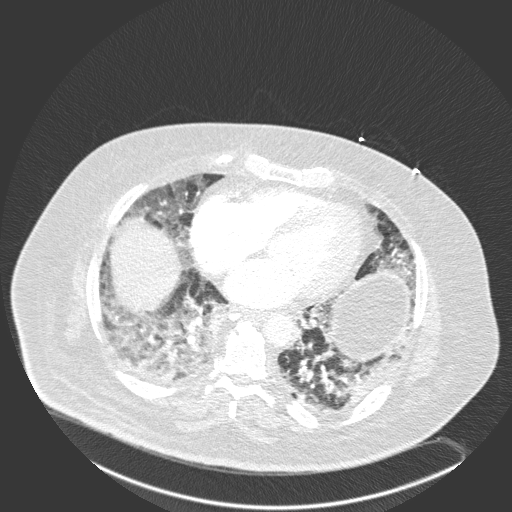
[im 107/272  soft-tissue]
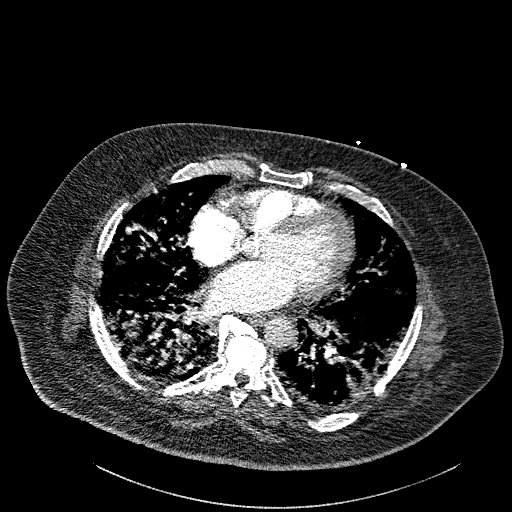
[im 130/272  lung]
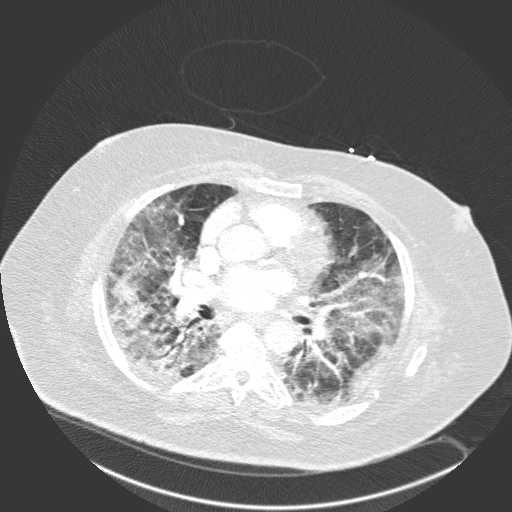
[im 142/272  soft-tissue]
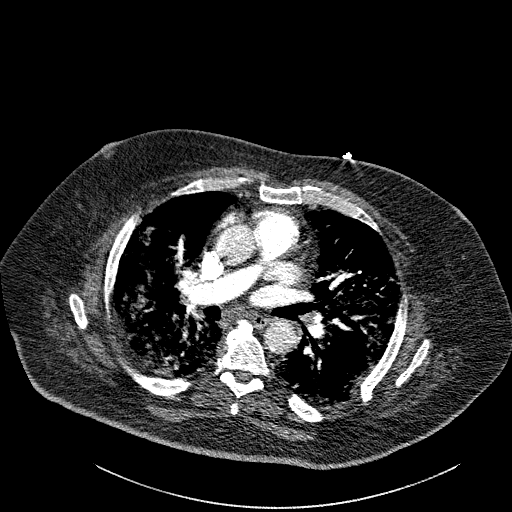
[im 165/272  lung]
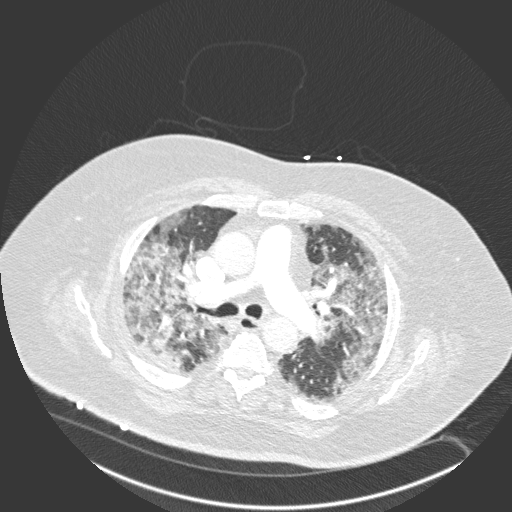
[im 177/272  soft-tissue]
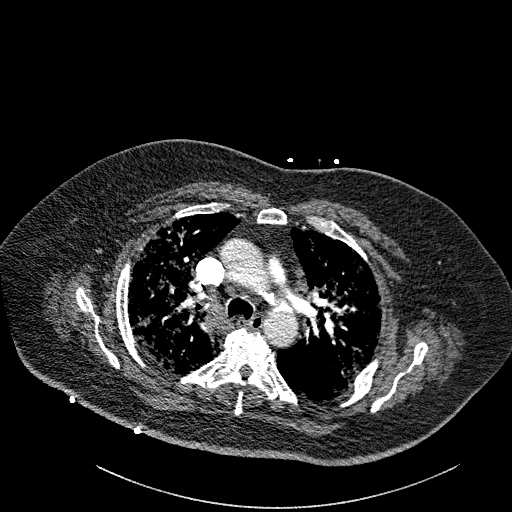
[im 201/272  lung]
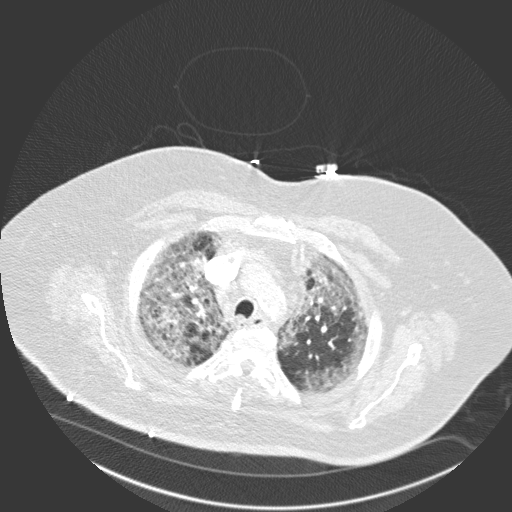
[im 224/272  soft-tissue]
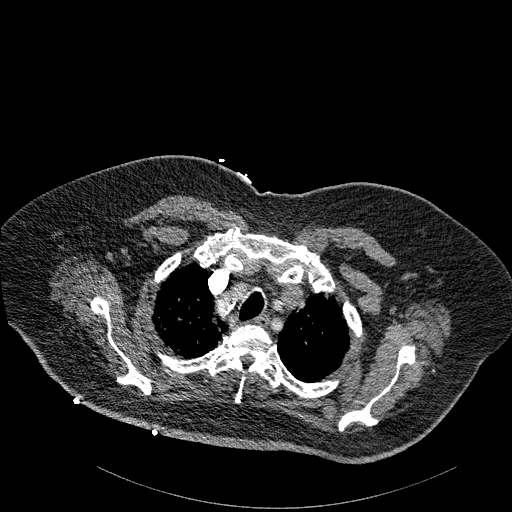
[im 236/272  lung]
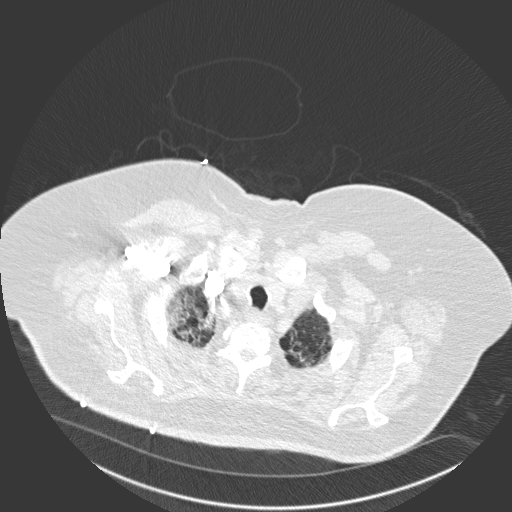
[im 260/272  soft-tissue]
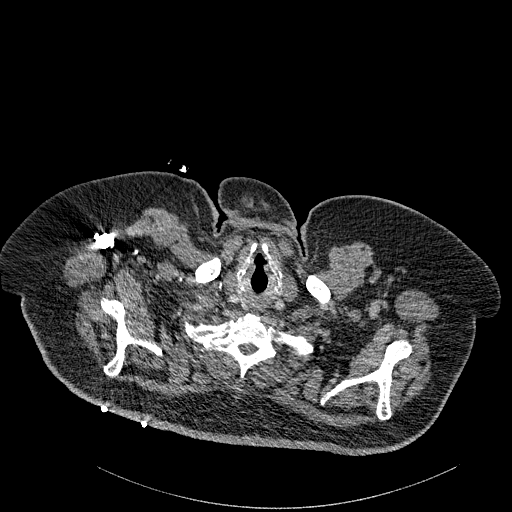

[Series 8: coronal mpr · coronal · 0.56mm/px · 3 of 151 slices shown]
[im 38/151  soft-tissue]
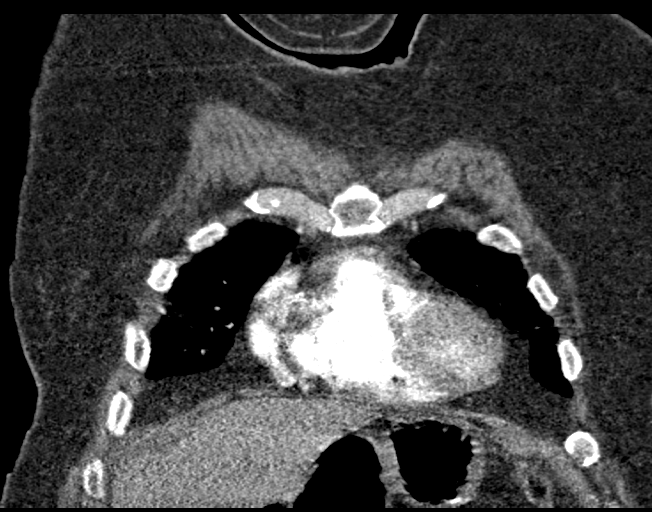
[im 76/151  soft-tissue]
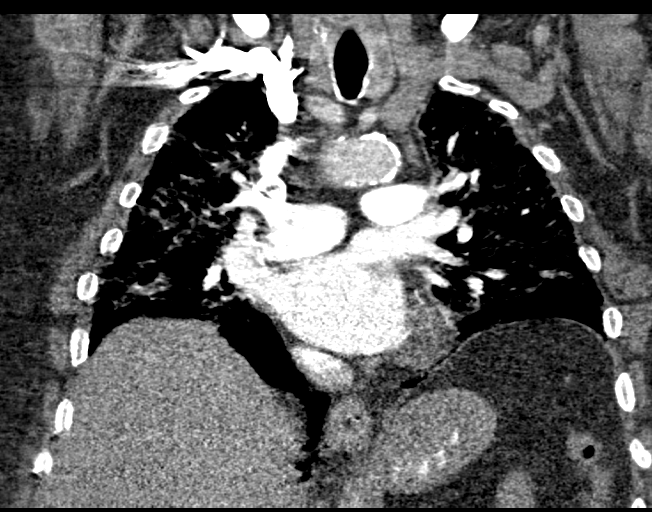
[im 113/151  soft-tissue]
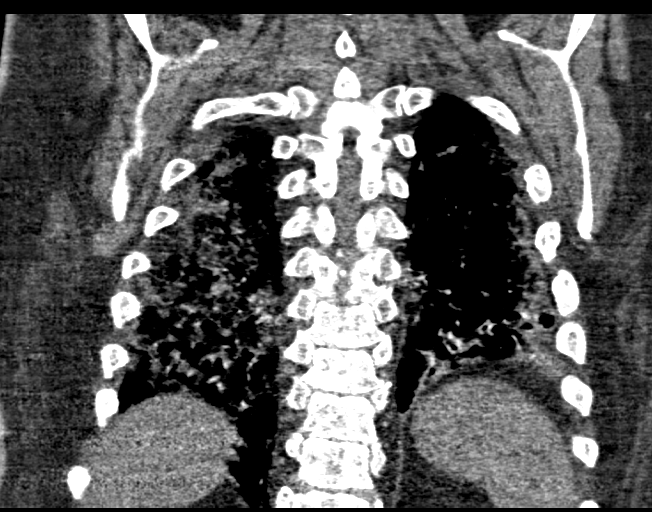

[17 of 46 positions shown; findings below may reference images not displayed]

FINDINGS: Cardiovascular: There is no demonstrable pulmonary embolus. There is
no thoracic aortic aneurysm or dissection. There are scattered foci
of great vessel calcification. There is aortic atherosclerosis.
There are multiple foci of coronary artery calcification. There is
no pericardial effusion or pericardial thickening.

Mediastinum/Nodes: There is a 6 mm nodular opacity in the left lobe
of the thyroid which per consensus guidelines does not warrant
additional imaging surveillance. Thyroid otherwise appears
unremarkable. There is a 1.4 x 1.1 cm subcarinal lymph node.
Elsewhere there are scattered subcentimeter mediastinal lymph nodes
which do not meet size criteria for pathologic significance. No
esophageal lesions are appreciable.

Lungs/Pleura: There is widespread airspace opacity throughout the
lungs diffusely with involvement of most lobes in segments. There is
mild fibrotic change in the apices. No appreciable pleural
effusions.

Upper Abdomen: In the visualized upper abdomen, there is aortic
atherosclerosis. Visualized upper abdominal structures otherwise
appear unremarkable.

Musculoskeletal: There is degenerative change in the mid and lower
thoracic spine with diffuse idiopathic skeletal hyperostosis. There
old healed rib fractures on the left with remodeling. No blastic or
lytic bone lesions. No evident chest wall lesions.

Review of the MIP images confirms the above findings.
IMPRESSION: 1. No demonstrable pulmonary embolus. No thoracic aortic aneurysm or
dissection. There is aortic atherosclerosis as well as foci of great
vessel and coronary artery calcification.

2. Widespread airspace opacity throughout the lungs diffusely
consistent with widespread multifocal pneumonia. Suspect atypical
organism pneumonia, particularly given history.

3. Mildly prominent subcarinal lymph node which may well be of
reactive etiology given the extensive lung parenchymal abnormality.
No other adenopathy by size criteria.

4.  Diffuse idiopathic skeletal hyperostosis.

Aortic Atherosclerosis ([id number]).

## 2022-01-13 IMAGING — DX DG CHEST 1V PORT
1 series · 1 of 1 positions shown · non-contrast
Comparison: 09/14/2019

CLINICAL DATA: Follow-up [id number] pneumonia

EXAM:
PORTABLE CHEST 1 VIEW

[chest ap]
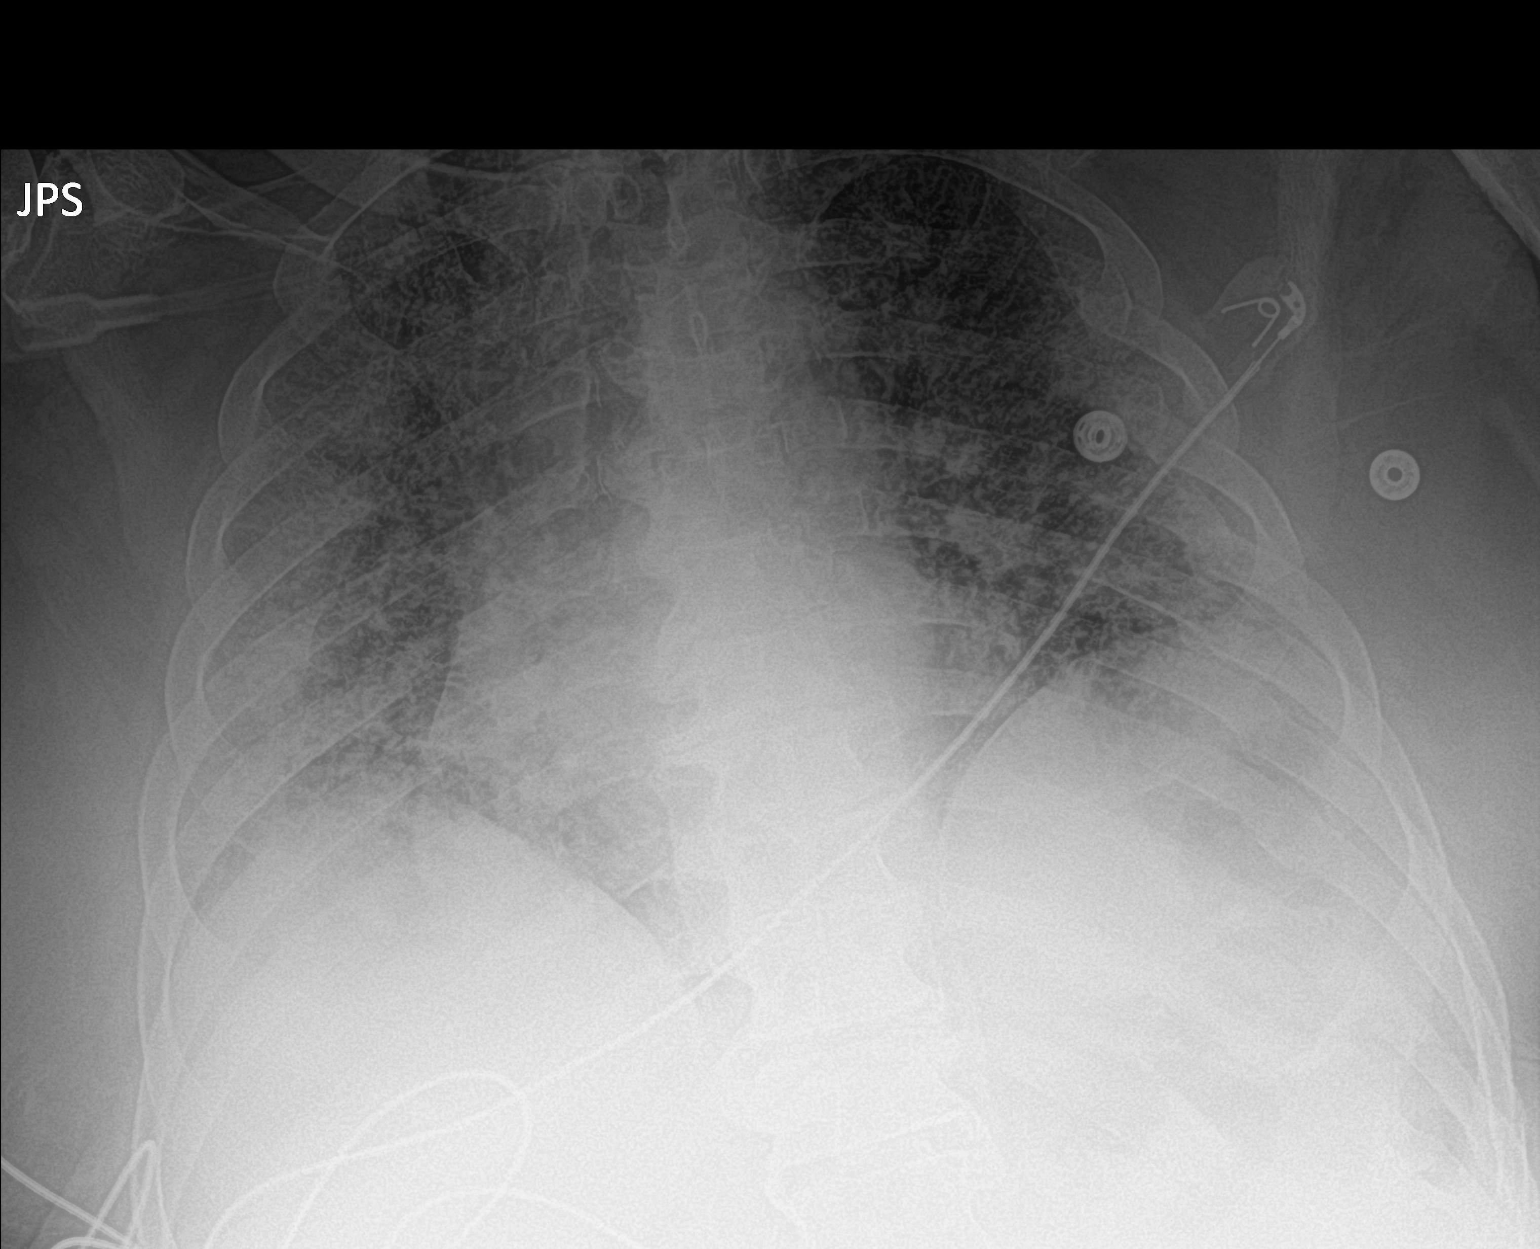

[1 of 1 positions shown; findings below may reference images not displayed]

FINDINGS: Cardiac shadow is stable. Persistent opacities are noted bilaterally
right greater than left consistent with the given clinical history.
Multiple left rib fractures are seen. Degenerative change of the
thoracic spine is noted.
IMPRESSION: Stable appearance of the chest given some technical variations in
the imaging.
